# Patient Record
Sex: Female | Born: 1970 | Race: Black or African American | Hispanic: No | Marital: Married | Smoking: Never smoker
Health system: Southern US, Community
[De-identification: ages and names within clinical notes are randomized; demographics above are authoritative.]

## PROBLEM LIST (undated history)

## (undated) DIAGNOSIS — R87810 Cervical high risk human papillomavirus (HPV) DNA test positive: Secondary | ICD-10-CM

## (undated) DIAGNOSIS — R011 Cardiac murmur, unspecified: Secondary | ICD-10-CM

## (undated) DIAGNOSIS — D649 Anemia, unspecified: Secondary | ICD-10-CM

## (undated) HISTORY — DX: Cervical high risk human papillomavirus (HPV) DNA test positive: R87.810

## (undated) HISTORY — PX: TUBAL LIGATION: SHX77

## (undated) HISTORY — DX: Cardiac murmur, unspecified: R01.1

## (undated) HISTORY — DX: Anemia, unspecified: D64.9

---

## 1998-04-24 ENCOUNTER — Emergency Department (HOSPITAL_COMMUNITY): Admission: EM | Admit: 1998-04-24 | Discharge: 1998-04-24 | Payer: Self-pay | Admitting: Emergency Medicine

## 1998-04-28 ENCOUNTER — Emergency Department (HOSPITAL_COMMUNITY): Admission: EM | Admit: 1998-04-28 | Discharge: 1998-04-28 | Payer: Self-pay | Admitting: Emergency Medicine

## 2000-08-07 ENCOUNTER — Emergency Department (HOSPITAL_COMMUNITY): Admission: EM | Admit: 2000-08-07 | Discharge: 2000-08-07 | Payer: Self-pay | Admitting: Emergency Medicine

## 2000-08-07 ENCOUNTER — Encounter: Payer: Self-pay | Admitting: Emergency Medicine

## 2002-06-03 ENCOUNTER — Emergency Department (HOSPITAL_COMMUNITY): Admission: EM | Admit: 2002-06-03 | Discharge: 2002-06-03 | Payer: Self-pay | Admitting: Emergency Medicine

## 2002-06-03 ENCOUNTER — Encounter: Payer: Self-pay | Admitting: Emergency Medicine

## 2005-10-31 ENCOUNTER — Emergency Department (HOSPITAL_COMMUNITY): Admission: EM | Admit: 2005-10-31 | Discharge: 2005-10-31 | Payer: Self-pay | Admitting: Emergency Medicine

## 2012-07-13 DIAGNOSIS — R87810 Cervical high risk human papillomavirus (HPV) DNA test positive: Secondary | ICD-10-CM

## 2012-07-13 HISTORY — DX: Cervical high risk human papillomavirus (HPV) DNA test positive: R87.810

## 2012-07-19 ENCOUNTER — Ambulatory Visit (INDEPENDENT_AMBULATORY_CARE_PROVIDER_SITE_OTHER): Payer: BC Managed Care – PPO | Admitting: Family Medicine

## 2012-07-19 VITALS — BP 110/62 | HR 74 | Temp 99.0°F | Resp 16 | Ht 73.0 in | Wt 138.2 lb

## 2012-07-19 DIAGNOSIS — R0981 Nasal congestion: Secondary | ICD-10-CM

## 2012-07-19 DIAGNOSIS — N951 Menopausal and female climacteric states: Secondary | ICD-10-CM

## 2012-07-19 DIAGNOSIS — J3489 Other specified disorders of nose and nasal sinuses: Secondary | ICD-10-CM

## 2012-07-19 DIAGNOSIS — R1900 Intra-abdominal and pelvic swelling, mass and lump, unspecified site: Secondary | ICD-10-CM

## 2012-07-19 DIAGNOSIS — R509 Fever, unspecified: Secondary | ICD-10-CM

## 2012-07-19 DIAGNOSIS — D509 Iron deficiency anemia, unspecified: Secondary | ICD-10-CM

## 2012-07-19 DIAGNOSIS — J029 Acute pharyngitis, unspecified: Secondary | ICD-10-CM

## 2012-07-19 DIAGNOSIS — R51 Headache: Secondary | ICD-10-CM

## 2012-07-19 DIAGNOSIS — R011 Cardiac murmur, unspecified: Secondary | ICD-10-CM

## 2012-07-19 DIAGNOSIS — R232 Flushing: Secondary | ICD-10-CM

## 2012-07-19 LAB — POCT CBC
Granulocyte percent: 62.2 %G (ref 37–80)
HCT, POC: 27.7 % — AB (ref 37.7–47.9)
Hemoglobin: 8.1 g/dL — AB (ref 12.2–16.2)
Lymph, poc: 1.4 (ref 0.6–3.4)
MCH, POC: 21.4 pg — AB (ref 27–31.2)
MCHC: 29.2 g/dL — AB (ref 31.8–35.4)
MCV: 73.2 fL — AB (ref 80–97)
MID (cbc): 0.5 (ref 0–0.9)
MPV: 7.7 fL (ref 0–99.8)
POC Granulocyte: 3.1 (ref 2–6.9)
POC LYMPH PERCENT: 27.7 %L (ref 10–50)
POC MID %: 10.1 %M (ref 0–12)
Platelet Count, POC: 397 10*3/uL (ref 142–424)
RBC: 3.78 M/uL — AB (ref 4.04–5.48)
RDW, POC: 18.3 %
WBC: 5 10*3/uL (ref 4.6–10.2)

## 2012-07-19 LAB — POCT RAPID STREP A (OFFICE): Rapid Strep A Screen: NEGATIVE

## 2012-07-19 LAB — COMPREHENSIVE METABOLIC PANEL
ALT: 9 U/L (ref 0–35)
AST: 16 U/L (ref 0–37)
Albumin: 4.4 g/dL (ref 3.5–5.2)
Alkaline Phosphatase: 30 U/L — ABNORMAL LOW (ref 39–117)
BUN: 12 mg/dL (ref 6–23)
CO2: 24 mEq/L (ref 19–32)
Calcium: 9.1 mg/dL (ref 8.4–10.5)
Chloride: 107 mEq/L (ref 96–112)
Creat: 0.64 mg/dL (ref 0.50–1.10)
Glucose, Bld: 71 mg/dL (ref 70–99)
Potassium: 4.5 mEq/L (ref 3.5–5.3)
Sodium: 136 mEq/L (ref 135–145)
Total Bilirubin: 0.4 mg/dL (ref 0.3–1.2)
Total Protein: 7.2 g/dL (ref 6.0–8.3)

## 2012-07-19 LAB — POCT URINE PREGNANCY: Preg Test, Ur: NEGATIVE

## 2012-07-19 LAB — POCT INFLUENZA A/B
Influenza A, POC: NEGATIVE
Influenza B, POC: NEGATIVE

## 2012-07-19 MED ORDER — HYDROCODONE-HOMATROPINE 5-1.5 MG/5ML PO SYRP: ORAL_SOLUTION | ORAL | Status: DC

## 2012-07-19 MED ORDER — IPRATROPIUM BROMIDE 0.06 % NA SOLN: 2.0000 | Freq: Three times a day (TID) | NASAL | Status: DC

## 2012-07-19 NOTE — Patient Instructions (Addendum)
Your ultrasound can be done at [facility] [address] [place]  tomorrow at 1:30pm please drink 32 ounces of water 1 hour prior for full bladder, needed for scan.

## 2012-07-19 NOTE — Progress Notes (Signed)
Patient ID: Victoria Schmitt MRN: [id number], DOB: 1971/01/29, 41 y.o. Date of Encounter: 07/19/2012, 12:30 PM  Primary Physician: No primary provider on file.  Chief Complaint: URI symptoms for one day  HPI: 41 y.o. year old female with history below presents with sudden onset of nasal congestion, rhinorrhea, eyes burning, sore throat, ear fullness, and sinus pressure. Symptoms began suddenly the previous evening around 9 PM. Subjective fever and chills over the evening, night, and into today. Patient is generally warm natured, however currently she having chills as well. Headache along the frontal sinus down into the nasal cavity. Nasal congestion clear. Hearing muffled. No cough or chest congestion. Husband sick with similar symptoms, just not as bad as her. Has not taken any medications to try to help her symptoms.  In regards to her above complaint of always feeling warm, for as long as she can remember she has always been hot natured. Has always been thin. Wants to put weight on, and tries to eat to do so, but is unable to do so. When she is active notes that her heart races. No diarrhea. Uncertain if she has had an evaluation of her thyroid before. States that she feels like she always has to be active, however when she is not active she is able to easily fall asleep.  She also mentions during my physical exam today that she has had a heart murmur since childhood, but without formal evaluation. Never with echocardiogram. Notes that throughout life when she would try to run or be active she would get fatigued easily, become short of breath, feel like her heart was racing, and sometimes develop chest pain. Because of this she no longer runs very much. She is currently asymptomatic.   Lastly, she mentions a three month history of an enlarging lower abdominal mass. When she first noticed this mass it was "the size of a golf ball." Now it has continued to enlarge and "has a pulse." She cannot lay on her  stomach because it hurts. She did have BTL 22 years prior. She is sexually active with her husband. No condoms. LMP first part of October, 2013.    History reviewed. No pertinent past medical history.   Home Meds: Prior to Admission medications   Not on File    Allergies: No Known Allergies  History   Social History  . Marital Status: Married    Spouse Name: N/A    Number of Children: N/A  . Years of Education: N/A   Occupational History  . Not on file.   Social History Main Topics  . Smoking status: Never Smoker   . Smokeless tobacco: Not on file  . Alcohol Use: Not on file  . Drug Use: Not on file  . Sexually Active: Not on file   Other Topics Concern  . Not on file   Social History Narrative  . No narrative on file     Review of Systems: Constitutional: negative for night sweats, or weight changes HEENT: see above Cardiovascular: negative for chest pain or palpitations Respiratory: negative for hemoptysis, wheezing, shortness of breath, or cough Abdominal: negative for nausea, vomiting, diarrhea, or constipation Dermatological: negative for rash Neurologic: negative for headache, dizziness, or syncope All other systems reviewed and are otherwise negative with the exception to those above and in the HPI.   Physical Exam: Blood pressure 110/62, pulse 74, temperature 99 F (37.2 C), temperature source Oral, resp. rate 16, height 6\' 1"  (1.854 m), weight 138 lb 3.2  oz (62.687 kg), last menstrual period 06/12/2012, SpO2 100.00%., Body mass index is 18.23 kg/(m^2). General: Well developed, well nourished, in no acute distress. Head: Normocephalic, atraumatic, eyes without discharge, sclera non-icteric, nares are without discharge. Bilateral auditory canals clear, TM's are without perforation, pearly grey and translucent with reflective cone of light bilaterally. Frontal sinus TTP. Oral cavity moist, posterior pharynx without exudate, erythema, peritonsillar abscess, or  post nasal drip. Uvula midline.   Neck: Supple. No thyromegaly or nodules palpated. Full ROM. No lymphadenopathy. Lungs: Clear bilaterally to auscultation without wheezes, rales, or rhonchi. Breathing is unlabored. Heart: RRR with S1 S2. 3/6 systolic murmur LUSB. No rubs or gallops appreciated. Abdomen: Soft, with normoactive bowel sounds. Palpable abdominal mass LLQ that is TTP. No hepatosplenomegaly. No rebound/guarding. No midline abdominal pulsations. Negative McBurney's and Iliopsoas.  Msk:  Strength and tone normal for age. Extremities/Skin: Warm and dry. No clubbing or cyanosis. No edema. No rashes or suspicious lesions. Neuro: Alert and oriented X 3. Moves all extremities spontaneously. Gait is normal. CNII-XII grossly in tact. Psych:  Responds to questions appropriately with a normal affect.   Labs: Results for orders placed in visit on 07/19/12  POCT CBC      Component Value Range   WBC 5.0  4.6 - 10.2 K/uL   Lymph, poc 1.4  0.6 - 3.4   POC LYMPH PERCENT 27.7  10 - 50 %L   MID (cbc) 0.5  0 - 0.9   POC MID % 10.1  0 - 12 %M   POC Granulocyte 3.1  2 - 6.9   Granulocyte percent 62.2  37 - 80 %G   RBC 3.78 (*) 4.04 - 5.48 M/uL   Hemoglobin 8.1 (*) 12.2 - 16.2 g/dL   HCT, POC 16.1 (*) 09.6 - 47.9 %   MCV 73.2 (*) 80 - 97 fL   MCH, POC 21.4 (*) 27 - 31.2 pg   MCHC 29.2 (*) 31.8 - 35.4 g/dL   RDW, POC 04.5     Platelet Count, POC 397  142 - 424 K/uL   MPV 7.7  0 - 99.8 fL  POCT INFLUENZA A/B      Component Value Range   Influenza A, POC Negative     Influenza B, POC Negative    POCT RAPID STREP A (OFFICE)      Component Value Range   Rapid Strep A Screen Negative  Negative  POCT URINE PREGNANCY      Component Value Range   Preg Test, Ur Negative     Throat culture pending TSH, CMP, ferritin, and iron all pending.  ASSESSMENT AND PLAN:  41 y.o. year old female with viral URI, cardiac murmur, microcytic anemia, abdominal mass, and heat intolerance. 1. Viral  URI -Atrovent NS 0.06% 2 sprays each nare bid prn #1 no RF -Hycodan #4oz 1 tsp po q 4-6 hours prn cough no RF SED  2. Cardiac murmur -Longstanding since childhood -Referral to cardiology for evaluation -Refrain from physical activity until cleared by cardiology at this time  3. Abdominal mass -Negative HCG in office -Status post BTL 22 years prior -Pelvic ultrasound with call report today -If fibroid will refer to gyn  4. Microcytic anemia -Add ferritin and iron -Await labs -Treatment pending   5. Heat intolerance -Long standing issue -No formal evaluation -Will await TSH and CMP -Further follow up and treatment pending labs  6. Patient discussed with Dr. Patsy Lager   Signed, Eula Listen, PA-C 07/19/2012 12:30 PM

## 2012-07-20 ENCOUNTER — Ambulatory Visit
Admission: RE | Admit: 2012-07-20 | Discharge: 2012-07-20 | Disposition: A | Discharge status: Home / Self Care | Payer: BC Managed Care – PPO | Source: Ambulatory Visit | Attending: Physician Assistant | Admitting: Physician Assistant

## 2012-07-20 ENCOUNTER — Other Ambulatory Visit: Payer: Self-pay | Admitting: Physician Assistant

## 2012-07-20 ENCOUNTER — Ambulatory Visit
Admission: RE | Admit: 2012-07-20 | Discharge: 2012-07-20 | Disposition: A | Discharge status: Home / Self Care | Payer: BC Managed Care – PPO | Source: Ambulatory Visit | Attending: Family Medicine | Admitting: Family Medicine

## 2012-07-20 DIAGNOSIS — R1900 Intra-abdominal and pelvic swelling, mass and lump, unspecified site: Secondary | ICD-10-CM

## 2012-07-20 DIAGNOSIS — D219 Benign neoplasm of connective and other soft tissue, unspecified: Secondary | ICD-10-CM

## 2012-07-20 LAB — FERRITIN: Ferritin: <1 ng/mL — ABNORMAL LOW (ref 10–291)

## 2012-07-20 LAB — TSH: TSH: 1.453 u[IU]/mL (ref 0.350–4.500)

## 2012-07-21 LAB — CULTURE, GROUP A STREP: Organism ID, Bacteria: NORMAL

## 2012-07-22 ENCOUNTER — Encounter: Payer: Self-pay | Admitting: Family Medicine

## 2012-07-22 ENCOUNTER — Other Ambulatory Visit: Payer: Self-pay | Admitting: Family Medicine

## 2012-07-22 DIAGNOSIS — D259 Leiomyoma of uterus, unspecified: Secondary | ICD-10-CM

## 2012-08-03 ENCOUNTER — Ambulatory Visit (INDEPENDENT_AMBULATORY_CARE_PROVIDER_SITE_OTHER): Payer: BC Managed Care – PPO | Admitting: Gynecology

## 2012-08-03 ENCOUNTER — Encounter: Payer: Self-pay | Admitting: Gynecology

## 2012-08-03 ENCOUNTER — Other Ambulatory Visit (HOSPITAL_COMMUNITY)
Admission: RE | Admit: 2012-08-03 | Discharge: 2012-08-03 | Disposition: A | Discharge status: Home / Self Care | Payer: BC Managed Care – PPO | Source: Ambulatory Visit | Attending: Gynecology | Admitting: Gynecology

## 2012-08-03 VITALS — BP 130/70 | Ht 70.25 in | Wt 133.0 lb

## 2012-08-03 DIAGNOSIS — N920 Excessive and frequent menstruation with regular cycle: Secondary | ICD-10-CM

## 2012-08-03 DIAGNOSIS — R011 Cardiac murmur, unspecified: Secondary | ICD-10-CM

## 2012-08-03 DIAGNOSIS — Z1151 Encounter for screening for human papillomavirus (HPV): Secondary | ICD-10-CM | POA: Insufficient documentation

## 2012-08-03 DIAGNOSIS — Z01419 Encounter for gynecological examination (general) (routine) without abnormal findings: Secondary | ICD-10-CM | POA: Insufficient documentation

## 2012-08-03 DIAGNOSIS — Z124 Encounter for screening for malignant neoplasm of cervix: Secondary | ICD-10-CM

## 2012-08-03 DIAGNOSIS — R87810 Cervical high risk human papillomavirus (HPV) DNA test positive: Secondary | ICD-10-CM | POA: Insufficient documentation

## 2012-08-03 DIAGNOSIS — D259 Leiomyoma of uterus, unspecified: Secondary | ICD-10-CM

## 2012-08-03 DIAGNOSIS — D649 Anemia, unspecified: Secondary | ICD-10-CM

## 2012-08-03 NOTE — Progress Notes (Signed)
Victoria Schmitt Oct 21, 1970 [id number]        41 y.o.  G2P2 presents in consultation from Dr. Alycia Rossetti urgent medical care due to anemia and leiomyoma. Patient has known that she has had fibroids for some time but has not had routine GYN care and said that her last GYN exam was during her pregnancies a number of years ago. Her menses are heavy usually lasting 5 days the following very heavy for one to 2 days. Frequent pad changes in bleedthrough episodes. Most recent evaluation at urgent medical care showed a hemoglobin of 8 and an ultrasound showing an enlarged uterus measuring 18.5 x 9.3 x 15.4 cm. with multiple myomas. Right and left ovaries are not visualized for technical reasons.  She notes pelvic discomfort and the inability to lie on her stomach.  Past medical history,surgical history, medications, allergies, family history and social history were all reviewed and documented in the EPIC chart. ROS:  Was performed and pertinent positives and negatives are included in the history.  Exam: Sherrilyn Rist assistant Filed Vitals:   08/03/12 1437  BP: 130/70  Height: 5' 10.25" (1.784 m)  Weight: 133 lb (60.328 kg)   General appearance  Normal Skin grossly normal Head/Neck normal with no cervical or supraclavicular adenopathy thyroid normal Lungs  clear Cardiac RR, 2/6 systolic ejection murmur Abdominal  soft, nontender with large pelvic mass to the umbilicus firm consistent with history of leiomyoma. Breasts  examined lying and sitting without masses, retractions, discharge or axillary adenopathy. Pelvic  Ext/BUS/vagina  normal   Cervix  normal Pap/HPV  Uterus to the level of the umbilicus, firm irregular consistent with leiomyoma  Adnexa  Unable to evaluate due to uterine size  Anus and perineum  normal   Rectovaginal  normal sphincter tone without palpated masses or tenderness.    Assessment/Plan:  41 y.o. G2P2 female status post tubal sterilization  1. Leiomyoma/anemia/menorrhagia/pelvic pain.  Uterus grossly enlarged 20 week size. Options for management were reviewed to include observation, hormonal manipulation, myomectomy, uterine artery embolization, hysterectomy.  Given her total picture is that hysterectomy is the most reasonable course as she is status post tubal sterilization tell paranasal longer an issue. Recommend nasal suppression to allow hemoglobin recovery and then proceeding with a TAH. Tentatively plan Depo-Lupron x10-12 weeks with surgery in that window. She is on a multivitamin with iron and a separate iron pill emphasize the need to continue this on a daily basis. Side effects and risks of Depo-Lupron reviewed to include menopausal symptoms as well as accelerated bone loss. Patient agrees with the plan we'll move toward scheduling the surgery. She'll represent for those hemoglobin recheck as well as a preoperative consult. 2. Heart murmur. Patient says she's always had and I suspect is a functional murmur. Will have cardiology evaluate preoperative to make sure. I'm sure is accentuated due to her anemia. 3. Pap smear. Pap/HPV done this has not been done for several years. 4. Mammography. Patient's never had mammogram and I recommended she schedule this as baseline and she agrees to do so. SBE monthly reviewed.  We will go ahead and arrange for Depo-Lupron, schedule her surgery, she'll represent for a preoperative consult before hand, she'll continue on her iron and we'll get a cardiology consult in the interim.    Dara Lords MD, 4:33 PM 08/03/2012

## 2012-08-03 NOTE — Patient Instructions (Signed)
Uterine Fibroid  A uterine fibroid is a growth (tumor) that occurs in a woman's uterus. This type of tumor is not cancerous and does not spread out of the uterus. A woman can have one or many fibroids, and the fiboid(s) can become quite large. A fibroid can vary in size, weight, and where it grows in the uterus. Most fibroids do not require medical treatment, but some can cause pain or heavy bleeding during and between periods.  CAUSES   A fibroid is the result of a single uterine cell that keeps growing (unregulated), which is different than most cells in the human body. Most cells have a control mechanism that keeps them from reproducing without control.   SYMPTOMS    Bleeding.   Pelvic pain and pressure.   Bladder problems due to the size of the fibroid.   Infertility and miscarriages depending on the size and location of the fibroid.  DIAGNOSIS   A diagnosis is made by physical exam. Your caregiver may feel the lumpy tumors during a pelvic exam. Important information regarding size, location, and number of tumors can be gained by having an ultrasound. It is rare that other tests, such as a CT scan or MRI, are needed.  TREATMENT    Your caregiver may recommend watchful waiting. This involves getting the fibroid checked by your caregiver to see if the fibroids grow or shrink.    Hormonal treatment or an intrauterine device (IUD) may be prescribed.    Surgery may be needed to remove the fibroids (myomectomy) or the uterus (hysterectomy). This depends on your situation.  When fibroids interfere with fertility and a woman wants to become pregnant, a caregiver may recommend having the fibroids removed.   HOME CARE INSTRUCTIONS   Home care depends on how you were treated. In general:    Keep all follow-up appointments with your caregiver.    Only take medicine as told by your caregiver. Do not take aspirin. It can cause bleeding.    If you have excessive periods and soak tampons or pads in a half hour or  less, contact your caregiver immediately. If your periods are troublesome but not so heavy, lie down with your feet raised slightly above your heart. Place cold packs on your lower abdomen.    If your periods are heavy, write down the number of pads or tampons you use per month. Bring this information to your caregiver.    Talk to your caregiver about taking iron pills.    Include green vegetables in your diet.    If you were prescribed a hormonal treatment, take the hormonal medicines as directed.    If you need surgery, ask your caregiver for information on your specific surgery.   SEEK IMMEDIATE MEDICAL CARE IF:   You have pelvic pain or cramps not controlled with medicines.    You have a sudden increase in pelvic pain.    You have an increase of bleeding between and during periods.    You feel lightheaded or have fainting episodes.   MAKE SURE YOU:   Understand these instructions.   Will watch your condition.   Will get help right away if you are not doing well or get worse.  Document Released: 08/26/2000 Document Revised: 11/21/2011 Document Reviewed: 09/19/2011  ExitCare Patient Information 2013 [employer].

## 2012-08-06 ENCOUNTER — Telehealth: Payer: Self-pay | Admitting: Gynecology

## 2012-08-06 NOTE — Telephone Encounter (Signed)
Left message for patient to call to let her know I will fax form for Lupron and she will hear from them with benefits and she will need to give them okay to ship Rx to [place].  Also, to discuss with her Dr. Velvet Bathe wants her to see  Dr. Donnie Aho, cardiologist, for heart murmur.

## 2012-08-07 ENCOUNTER — Ambulatory Visit: Payer: Self-pay | Admitting: Cardiovascular Disease

## 2012-08-13 ENCOUNTER — Telehealth: Payer: Self-pay | Admitting: Gynecology

## 2012-08-13 ENCOUNTER — Encounter: Payer: Self-pay | Admitting: Gynecology

## 2012-08-13 MED ORDER — METRONIDAZOLE 500 MG PO TABS: 500.0000 mg | ORAL_TABLET | Freq: Two times a day (BID) | ORAL | Status: DC

## 2012-08-13 NOTE — Telephone Encounter (Signed)
Tell patient that her Pap smear showed: 1. Trichomonas which is a bacterial-like infection. It can be sexually transmitted. I recommend Flagyl 500 mg twice a day x7 days. Alcohol avoidance during this time.  Sexual contacts need to be seen and treated. I am going to run a GC/Chlamydia screen on her Pap smear also. 2. 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 Recommend repeat Pap smear in one year to follow up on this.

## 2012-08-14 NOTE — Telephone Encounter (Signed)
Left another message for patient to call me for results and to discuss Lupron arrangements.

## 2012-08-14 NOTE — Telephone Encounter (Signed)
Left message patient to call me for results.

## 2012-08-15 NOTE — Telephone Encounter (Signed)
Patient informed of all below. Rx already e-scribed by Dr. Velvet Bathe. Pt advised.

## 2012-08-15 NOTE — Telephone Encounter (Signed)
Patient called.  We discussed that she has received call from [facility] but has not called them back.  I told her it is important she return their call as they are going to review ins benefits. and cost of Lupron with her. She will need to be the one to authorize shipping to [place].  She said she will call them when she gets off work today. I discussed with patient that Dr. Velvet Bathe has recommended evaluation of heart murmur preoperatively for clearance for surgery.  Patient already has appointment scheduled with Dr. Eden Emms at [facility] for Dec 12th.  I called and asked them to add to appt notes that appt was for cardiac clearance for TAH.    I will contact patient when her Lupron arrive.

## 2012-08-22 ENCOUNTER — Encounter: Payer: Self-pay | Admitting: Cardiovascular Disease

## 2012-08-22 DIAGNOSIS — D649 Anemia, unspecified: Secondary | ICD-10-CM | POA: Insufficient documentation

## 2012-08-22 DIAGNOSIS — D219 Benign neoplasm of connective and other soft tissue, unspecified: Secondary | ICD-10-CM | POA: Insufficient documentation

## 2012-08-22 DIAGNOSIS — R011 Cardiac murmur, unspecified: Secondary | ICD-10-CM | POA: Insufficient documentation

## 2012-08-23 ENCOUNTER — Encounter: Payer: Self-pay | Admitting: Cardiovascular Disease

## 2012-08-23 ENCOUNTER — Ambulatory Visit (INDEPENDENT_AMBULATORY_CARE_PROVIDER_SITE_OTHER): Payer: BC Managed Care – PPO | Admitting: Cardiovascular Disease

## 2012-08-23 VITALS — BP 105/68 | HR 58 | Ht 72.0 in | Wt 134.0 lb

## 2012-08-23 DIAGNOSIS — D219 Benign neoplasm of connective and other soft tissue, unspecified: Secondary | ICD-10-CM

## 2012-08-23 DIAGNOSIS — D259 Leiomyoma of uterus, unspecified: Secondary | ICD-10-CM

## 2012-08-23 DIAGNOSIS — D649 Anemia, unspecified: Secondary | ICD-10-CM

## 2012-08-23 DIAGNOSIS — R011 Cardiac murmur, unspecified: Secondary | ICD-10-CM

## 2012-08-23 NOTE — Assessment & Plan Note (Signed)
FESO$ and erythropoitin  Monitor post op  I think her dyspea is related to this and not her heart

## 2012-08-23 NOTE — Assessment & Plan Note (Signed)
AV murmur  Echo does not sound severe  No need for SBE

## 2012-08-23 NOTE — Patient Instructions (Signed)
Your physician recommends that you schedule a follow-up appointment in:  AS NEEDED Your physician recommends that you continue on your current medications as directed. Please refer to the Current Medication list given to you today. Your physician has requested that you have an echocardiogram. Echocardiography is a painless test that uses sound waves to create images of your heart. It provides your doctor with information about the size and shape of your heart and how well your heart's chambers and valves are working. This procedure takes approximately one hour. There are no restrictions for this procedure. DX MURMUR

## 2012-08-23 NOTE — Assessment & Plan Note (Signed)
Clear to have Ob/Gyn surgery

## 2012-08-23 NOTE — Progress Notes (Signed)
Patient ID: Victoria Schmitt, female   DOB: [birth year], 41 y.o.   MRN: [id number] 41 yo referred by [facility] for surgical clearance Has murmur.  Needs TAH for leiyomyoma and anemia.  Indicates long standing history of murmur Mild exertional dyspnea since she has been anemic.  Never had echo. No chest pain or palpitations. No family history of congential heart disease.  Denies history of rheumatic fever Lupus SBE.  Needs a shot before surgery ? Erythropoitin.    ROS: Denies fever, malais, weight loss, blurry vision, decreased visual acuity, cough, sputum, SOB, hemoptysis, pleuritic pain, palpitaitons, heartburn, abdominal pain, melena, lower extremity edema, claudication, or rash.  All other systems reviewed and negative   General: Affect appropriate Thin black female HEENT: normal Neck supple with no adenopathy JVP normal no bruits no thyromegaly Lungs clear with no wheezing and good diaphragmatic motion Heart:  S1/S2 2/6 SEM louder in recumbancy no ,rub, gallop or click PMI normal Abdomen: benighn, BS positve, no tenderness, no AAA no bruit.  No HSM or HJR Distal pulses intact with no bruits No edema Neuro non-focal Skin warm and dry No muscular weakness  Medications Current Outpatient Prescriptions  Medication Sig Dispense Refill  . HYDROcodone-homatropine (HYCODAN) 5-1.5 MG/5ML syrup 1 TSP PO Q 4-6 HOURS PRN COUGH  120 mL  0  . ipratropium (ATROVENT) 0.06 % nasal spray Place 2 sprays into the nose 3 (three) times daily.  15 mL  0  . IRON PO Take by mouth.      . metroNIDAZOLE (FLAGYL) 500 MG tablet Take 1 tablet (500 mg total) by mouth 2 (two) times daily. For 7 days.  Avoid alcohol while taking  14 tablet  0  . Multiple Vitamin (MULTIVITAMIN) tablet Take 1 tablet by mouth daily.        Allergies Review of patient's allergies indicates no known allergies.  Family History: Family History  Problem Relation Age of Onset  . Diabetes Maternal Grandmother   . Seizures Maternal Grandfather    . Cancer Maternal Aunt   . Cancer Maternal Uncle   . Diabetes Paternal Grandmother   . Diabetes Paternal Grandfather     Social History: History   Social History  . Marital Status: Married    Spouse Name: N/A    Number of Children: N/A  . Years of Education: N/A   Occupational History  . Not on file.   Social History Main Topics  . Smoking status: Never Smoker   . Smokeless tobacco: Never Used  . Alcohol Use: Yes     Comment: occassional  . Drug Use: No  . Sexually Active: Yes    Birth Control/ Protection: Surgical   Other Topics Concern  . Not on file   Social History Narrative  . No narrative on file    Electrocardiogram:  SR rate 58 normal  Assessment and Plan

## 2012-08-31 ENCOUNTER — Other Ambulatory Visit (HOSPITAL_COMMUNITY): Payer: BC Managed Care – PPO

## 2012-09-06 ENCOUNTER — Ambulatory Visit (HOSPITAL_COMMUNITY): Payer: BC Managed Care – PPO | Attending: Cardiovascular Disease | Admitting: Radiology

## 2012-09-06 DIAGNOSIS — I369 Nonrheumatic tricuspid valve disorder, unspecified: Secondary | ICD-10-CM | POA: Insufficient documentation

## 2012-09-06 DIAGNOSIS — I059 Rheumatic mitral valve disease, unspecified: Secondary | ICD-10-CM | POA: Insufficient documentation

## 2012-09-06 DIAGNOSIS — R011 Cardiac murmur, unspecified: Secondary | ICD-10-CM

## 2012-09-06 NOTE — Progress Notes (Signed)
Echocardiogram performed.  

## 2012-09-07 ENCOUNTER — Telehealth: Payer: Self-pay | Admitting: Cardiovascular Disease

## 2012-09-07 NOTE — Telephone Encounter (Signed)
New Problem: ° ° ° °Patient returned your call.  Please call back. °

## 2012-09-07 NOTE — Telephone Encounter (Signed)
Lmtcb./cy 

## 2012-09-10 ENCOUNTER — Telehealth: Payer: Self-pay | Admitting: Gynecology

## 2012-09-10 NOTE — Telephone Encounter (Signed)
Patient called and said she spoke with the Lupron people and they told her she had $400 copayment and she thought they were going to send [place] the medication.  She asked about her surgery date.  I called [employer] as that is the last fax contact I had.  The rep at [employer] said that they had spoken with the patient and she was going to check on co-payment financial assistance and once she had done that she needs to call them back and authorize shipment of medication.  I called patient back and left a message with the above and the phone number that she needs to call to authorize shipment of Lupron.  I did explain to her that it would be 10-12 weeks from injection before we would schedule surgery and I am waiting on Feb schedule at this point.

## 2012-09-10 NOTE — Telephone Encounter (Signed)
I contacted patient to follow up with her regarding Lupron.  I had received several faxes indicating ins benefits and that no prior auth required.  Also, one that said rx was sent to [facility] and awaiting authorization from patient to fill it.  I left message today and asked patient to get back with me to let me know status of this and if this is something she is planning to do.

## 2012-09-25 ENCOUNTER — Encounter: Payer: Self-pay | Admitting: *Deleted

## 2012-09-25 NOTE — Telephone Encounter (Signed)
RESULTS LETTER SENT TO PT VIA MAIL .Victoria Schmitt

## 2012-09-26 ENCOUNTER — Telehealth: Payer: Self-pay

## 2012-09-26 NOTE — Telephone Encounter (Signed)
Left message that Lupron has arrived and to please call me so I can schedule a time for her to receive injection.  Also, reminded her surgery to be scheduled 10-12 weeks after injection (end of March beg of April) and I do not have those schedules yet.

## 2012-09-27 ENCOUNTER — Ambulatory Visit (INDEPENDENT_AMBULATORY_CARE_PROVIDER_SITE_OTHER): Payer: BC Managed Care – PPO | Admitting: *Deleted

## 2012-09-27 DIAGNOSIS — D259 Leiomyoma of uterus, unspecified: Secondary | ICD-10-CM

## 2012-09-27 DIAGNOSIS — N920 Excessive and frequent menstruation with regular cycle: Secondary | ICD-10-CM

## 2012-09-27 DIAGNOSIS — D649 Anemia, unspecified: Secondary | ICD-10-CM

## 2012-09-27 MED ORDER — LEUPROLIDE ACETATE (PED) 15 MG IM KIT: 15.0000 mg | PACK | INTRAMUSCULAR | Status: DC

## 2012-09-27 MED ORDER — LEUPROLIDE ACETATE (3 MONTH) 11.25 MG IM KIT
11.2500 mg | PACK | Freq: Once | INTRAMUSCULAR | Status: AC
  Administered 2012-09-27: 11.25 mg via INTRAMUSCULAR

## 2012-10-16 ENCOUNTER — Telehealth: Payer: Self-pay

## 2012-10-16 NOTE — Telephone Encounter (Signed)
Left message for patient to call to discuss scheduling surgery.  10 weeks from Lupron inj would be end of March.  I offered patient Mon March 31 or to wait for the April schedule.

## 2012-11-16 SURGERY — Surgical Case
Anesthesia: *Unknown

## 2012-11-23 ENCOUNTER — Telehealth: Payer: Self-pay | Admitting: *Deleted

## 2012-11-23 NOTE — Telephone Encounter (Signed)
Ok to have surgery

## 2012-11-23 NOTE — Telephone Encounter (Signed)
PT NEEDING TO HAVE HYSTERECTOMY  MAY PT PROCEED  ECHO WAS NORMAL /CY

## 2012-11-26 NOTE — Telephone Encounter (Signed)
Note forwarded to  Dr Reynold Bowen office . Pt may proceed with surgery per Dr Eden Emms.

## 2012-11-28 ENCOUNTER — Telehealth: Payer: Self-pay

## 2012-11-28 MED ORDER — NORETHINDRONE ACETATE 5 MG PO TABS: 5.0000 mg | ORAL_TABLET | Freq: Every day | ORAL | Status: DC

## 2012-11-28 NOTE — Telephone Encounter (Signed)
Patient complained of being terribly uncomfortable with hotflashes and wondered if Lupron could be causing. Asked if there was anything you could recommend?

## 2012-11-28 NOTE — Telephone Encounter (Signed)
I called patient and left message explaining to patient regarding need for Lupron 3.75 inectionj to hold her until soonest available date of April 29 for surgery.  I asked her to call me and let me know if this sounds okay for her.

## 2012-11-28 NOTE — Telephone Encounter (Signed)
Patient called. She would like to reschedule her surgery until 4/29 and understands need for Lupron 3.75mg .  I rescheduled her surgery and pre-op consult and will start the paper work for ordering Lupron.

## 2012-11-28 NOTE — Telephone Encounter (Signed)
Patient had Lupron 11.25 on 09/27/12.  TAH is scheduled for 12/17/12.  Patient's employer informed her that her Short Term Disability insurance does not go into effect until 12/26/12.  She asked about rescheduling her surgery until after this date in order to use her STD insurance while out of work for recovery.  12/20/12 is 12 weeks post Lupron.  The first date available to reschedule her to is 01/08/13 and that is 14.5 weeks from Lupron.  Will that work or will patient need another injection of Lupron?  Please advise.

## 2012-11-28 NOTE — Telephone Encounter (Signed)
Recommend Aygestin 5 mg daily. #30 with 1 refill prescribed

## 2012-11-28 NOTE — Telephone Encounter (Signed)
I would recommend a one-month 3.75 mg Depo-Lupron shot to cover her in that. Because I am afraid that she would start to have breakthrough bleeding in the interim.

## 2012-11-28 NOTE — Telephone Encounter (Signed)
Left message on voice mail that Dr. Velvet Bathe called in Rx.

## 2012-12-07 ENCOUNTER — Ambulatory Visit: Payer: BC Managed Care – PPO | Admitting: Gynecology

## 2012-12-11 ENCOUNTER — Telehealth: Payer: Self-pay

## 2012-12-11 NOTE — Telephone Encounter (Signed)
[facility] called to arrange shipment for Lupron 3.75mg  to arrive on Thursday 12/13/12. Ref #[id number]  I called patient and informed her. I will call her when it arrives to we can schedule her injection.

## 2012-12-13 ENCOUNTER — Telehealth: Payer: Self-pay

## 2012-12-13 ENCOUNTER — Ambulatory Visit (INDEPENDENT_AMBULATORY_CARE_PROVIDER_SITE_OTHER): Payer: BC Managed Care – PPO | Admitting: *Deleted

## 2012-12-13 DIAGNOSIS — D259 Leiomyoma of uterus, unspecified: Secondary | ICD-10-CM

## 2012-12-13 DIAGNOSIS — N920 Excessive and frequent menstruation with regular cycle: Secondary | ICD-10-CM

## 2012-12-13 MED ORDER — LEUPROLIDE ACETATE 3.75 MG IM KIT
3.7500 mg | PACK | Freq: Once | INTRAMUSCULAR | Status: AC
  Administered 2012-12-13: 3.75 mg via INTRAMUSCULAR

## 2012-12-13 NOTE — Telephone Encounter (Signed)
Left message on voice mail that Lupron has arrived and for patient to call me back and tell me what time she would like to come by today for injection.

## 2012-12-13 NOTE — Telephone Encounter (Signed)
Patient called and was coming in at 12:30pm for injection.

## 2012-12-25 ENCOUNTER — Other Ambulatory Visit (HOSPITAL_COMMUNITY): Payer: BC Managed Care – PPO

## 2012-12-26 ENCOUNTER — Encounter (HOSPITAL_COMMUNITY): Payer: Self-pay | Admitting: Pharmacist

## 2012-12-28 ENCOUNTER — Encounter (HOSPITAL_COMMUNITY): Payer: Self-pay

## 2012-12-28 ENCOUNTER — Encounter (HOSPITAL_COMMUNITY)
Admission: RE | Admit: 2012-12-28 | Discharge: 2012-12-28 | Disposition: A | Discharge status: Home / Self Care | Payer: BC Managed Care – PPO | Source: Ambulatory Visit | Attending: Gynecology | Admitting: Gynecology

## 2012-12-28 DIAGNOSIS — Z01818 Encounter for other preprocedural examination: Secondary | ICD-10-CM | POA: Insufficient documentation

## 2012-12-28 DIAGNOSIS — Z01812 Encounter for preprocedural laboratory examination: Secondary | ICD-10-CM | POA: Insufficient documentation

## 2012-12-28 LAB — CBC
HCT: 38.4 % (ref 36.0–46.0)
Hemoglobin: 12.6 g/dL (ref 12.0–15.0)
MCH: 30.4 pg (ref 26.0–34.0)
MCV: 92.8 fL (ref 78.0–100.0)
Platelets: 268 10*3/uL (ref 150–400)
RBC: 4.14 MIL/uL (ref 3.87–5.11)

## 2012-12-28 NOTE — Patient Instructions (Addendum)
20 Gracelynn Bircher  12/28/2012   Your procedure is scheduled on:  01/08/13  Enter through the Main Entrance of [facility] at 6 AM.  Pick up the phone at the desk and dial [phone].   Call this number if you have problems the morning of surgery: [phone]   Remember:   Do not eat food:After Midnight.  Do not drink clear liquids: After Midnight.  Take these medicines the morning of surgery with A SIP OF WATER: NA   Do not wear jewelry, make-up or nail polish.  Do not wear lotions, powders, or perfumes. You may wear deodorant.  Do not shave 48 hours prior to surgery.  Do not bring valuables to the hospital.  Contacts, dentures or bridgework may not be worn into surgery.  Leave suitcase in the car. After surgery it may be brought to your room.  For patients admitted to the hospital, checkout time is 11:00 AM the day of discharge.   Patients discharged the day of surgery will not be allowed to drive home.  Name and phone number of your driver: NA  Special Instructions: Shower using CHG 2 nights before surgery and the night before surgery.  If you shower the day of surgery use CHG.  Use special wash - you have one bottle of CHG for all showers.  You should use approximately 1/3 of the bottle for each shower.   Please read over the following fact sheets that you were given: MRSA Information

## 2012-12-31 ENCOUNTER — Encounter: Payer: Self-pay | Admitting: Gynecology

## 2012-12-31 ENCOUNTER — Ambulatory Visit: Payer: Self-pay | Admitting: Gynecology

## 2013-01-01 ENCOUNTER — Encounter: Payer: Self-pay | Admitting: Gynecology

## 2013-01-01 ENCOUNTER — Ambulatory Visit (INDEPENDENT_AMBULATORY_CARE_PROVIDER_SITE_OTHER): Payer: BC Managed Care – PPO | Admitting: Gynecology

## 2013-01-01 DIAGNOSIS — N949 Unspecified condition associated with female genital organs and menstrual cycle: Secondary | ICD-10-CM

## 2013-01-01 DIAGNOSIS — D259 Leiomyoma of uterus, unspecified: Secondary | ICD-10-CM

## 2013-01-01 DIAGNOSIS — D500 Iron deficiency anemia secondary to blood loss (chronic): Secondary | ICD-10-CM

## 2013-01-01 DIAGNOSIS — N920 Excessive and frequent menstruation with regular cycle: Secondary | ICD-10-CM

## 2013-01-01 DIAGNOSIS — R102 Pelvic and perineal pain: Secondary | ICD-10-CM

## 2013-01-01 NOTE — Patient Instructions (Signed)
Followup for surgery as scheduled. Call beforehand if you have any questions. 

## 2013-01-01 NOTE — H&P (Signed)
Victoria Schmitt 29-Mar-1971 [id number]   History and Physical  Chief complaint: Leiomyoma, menorrhagia, anemia, pelvic pain  History of present illness: 42 y.o. G2P2 initially presented November 2013 in consultation from urgent medical care due to anemia and leiomyoma. Patient has known that she has had fibroids for some time but has not had routine GYN care and said that her last GYN exam was during her pregnancies a number of years ago. Her menses are heavy usually lasting 5 days flowing very heavy for one to 2 days. Frequent pad changes and bleedthrough episodes. Evaluation at urgent medical care showed a hemoglobin of 8 and an ultrasound showing an enlarged uterus measuring 18.5 x 9.3 x 15.4 cm. with multiple myomas. Right and left ovaries are not visualized for technical reasons. She notes pelvic discomfort and the inability to lie on her stomach.  Patient is status post tubal sterilization. Options for management were reviewed to include observation, attempt at hormonal manipulation, uterine artery embolization, multiple myomectomy, hysterectomy.  Patient elects for hysterectomy and due to the anemia she has been on Depo-Lupron suppression with a most recent hemoglobin of 12. Options to attempt laparoscopic approach discussed but ultimately we agree the most prudent approach would be TAH and she is admitted for TAH.Marland Kitchen   Past medical history,surgical history, medications, allergies, family history and social history were all reviewed and documented in the EPIC chart. ROS:  Was performed and pertinent positives and negatives are included in the history of present illness.  Exam:  Kim assistant General: well developed, well nourished female, no acute distress HEENT: normal  Lungs: clear to auscultation without wheezing, rales or rhonchi  Cardiac: regular rate without rubs, murmurs or gallops  Abdomen: soft, mild tenderness to uterine manipulation. Uterus at umbilicus, nodular consistent with  leiomyoma  Pelvic: external bus vagina: normal   Cervix: grossly normal  Uterus: 18-20 week size irregular firm consistent with leiomyoma Adnexa: Unable to assess due to uterine size      Assessment/Plan:  42 y.o. G2P2 with significant symptomatic leiomyoma as evidenced by menorrhagia, anemia, pelvic pain. Options for management reviewed per above and she elects for TAH.  I reviewed the proposed surgery with her to include the expected intraoperative and postoperative courses as well as the recovery period. Absolute irreversible sterility associated with hysterectomy was discussed. Sexuality following hysterectomy reviewed to include the risks of persistent orgasmic dysfunction as well as persistent dyspareunia. The ovarian conservation issue was discussed and the options to remove both ovaries or keep both ovaries reviewed. Given her young age and no family history of ovarian cancer she elects for keeping both ovaries she understands the risk of ovarian disease in the future to include ovarian cancer. She also gives me permission to remove one or both ovaries if it is my intraoperative decision that it would be best for her and she accepts this.  The risks of infection requiring prolonged antibiotics as well as risk of abscess/hematoma formation requiring reoperation was reviewed. The risks of hemorrhage necessitating transfusion and the risks of transfusion to include transfusion reaction hepatitis HIV mad cow disease and other unknown entities was discussed with her.  Incisional complications requiring opening and draining of the incisions and closure by secondary intention as well as long-term issues of cosmetics/keloid and hernia formation reviewed. The risk of inadvertent injury to internal organs, either immediately recognized or delay recognized, including bowel bladder ureters vessels and nerves necessitating major exploratory reparative surgeries in the future reparative surgeries including ostomy  formation, bowel  resection, bladder repair, ureteral damage repair all reviewed understood and accepted. The patient's questions were answered to her satisfaction and she will be admitted for surgery. Of note her prior murmur from which she received cardiac clearance has resolved and I think this was due to her anemia as a flow murmur which has now been corrected. We also reviewed her preoperative Pap smear showed normal cytology but a positive high risk HPV screen and we discussed this. Issues as to whether to followup with Pap smears of the vaginal cuff afterwards or not was reviewed and at this point my recommendation would be to monitor at least times several the vaginal cuff with Pap smears in the future and we'll rediscuss this postoperatively.    Dara Lords MD, 11:27 AM 01/01/2013

## 2013-01-01 NOTE — Progress Notes (Signed)
Victoria Schmitt 03-05-1971 [id number]   Preoperative consultation  Chief complaint: Leiomyoma, menorrhagia, anemia, pelvic pain  History of present illness: 42 y.o. G2P2 initially presented November 2013 in consultation from urgent medical care due to anemia and leiomyoma. Patient has known that she has had fibroids for some time but has not had routine GYN care and said that her last GYN exam was during her pregnancies a number of years ago. Her menses are heavy usually lasting 5 days flowing very heavy for one to 2 days. Frequent pad changes and bleedthrough episodes. Evaluation at urgent medical care showed a hemoglobin of 8 and an ultrasound showing an enlarged uterus measuring 18.5 x 9.3 x 15.4 cm. with multiple myomas. Right and left ovaries are not visualized for technical reasons. She notes pelvic discomfort and the inability to lie on her stomach.  Patient is status post tubal sterilization. Options for management were reviewed to include observation, attempt at hormonal manipulation, uterine artery embolization, multiple myomectomy, hysterectomy.  Patient elects for hysterectomy and due to the anemia she has been on Depo-Lupron suppression with a most recent hemoglobin of 12. Options to attempt laparoscopic approach discussed but ultimately we agree the most prudent approach would be TAH and she is admitted for TAH.Marland Kitchen   Past medical history,surgical history, medications, allergies, family history and social history were all reviewed and documented in the EPIC chart. ROS:  Was performed and pertinent positives and negatives are included in the history of present illness.  Exam:  Kim assistant General: well developed, well nourished female, no acute distress HEENT: normal  Lungs: clear to auscultation without wheezing, rales or rhonchi  Cardiac: regular rate without rubs, murmurs or gallops  Abdomen: soft, mild tenderness to uterine manipulation. Uterus at umbilicus, nodular consistent with  leiomyoma  Pelvic: external bus vagina: normal   Cervix: grossly normal  Uterus: 18-20 week size irregular firm consistent with leiomyoma Adnexa: Unable to assess due to uterine size      Assessment/Plan:  42 y.o. G2P2 with significant symptomatic leiomyoma as evidenced by menorrhagia, anemia, pelvic pain. Options for management reviewed per above and she elects for TAH.  I reviewed the proposed surgery with her to include the expected intraoperative and postoperative courses as well as the recovery period. Absolute irreversible sterility associated with hysterectomy was discussed. Sexuality following hysterectomy reviewed to include the risks of persistent orgasmic dysfunction as well as persistent dyspareunia. The ovarian conservation issue was discussed and the options to remove both ovaries or keep both ovaries reviewed. Given her young age and no family history of ovarian cancer she elects for keeping both ovaries she understands the risk of ovarian disease in the future to include ovarian cancer. She also gives me permission to remove one or both ovaries if it is my intraoperative decision that it would be best for her and she accepts this.  The risks of infection requiring prolonged antibiotics as well as risk of abscess/hematoma formation requiring reoperation was reviewed. The risks of hemorrhage necessitating transfusion and the risks of transfusion to include transfusion reaction hepatitis HIV mad cow disease and other unknown entities was discussed with her.  Incisional complications requiring opening and draining of the incisions and closure by secondary intention as well as long-term issues of cosmetics/keloid and hernia formation reviewed. The risk of inadvertent injury to internal organs, either immediately recognized or delay recognized, including bowel bladder ureters vessels and nerves necessitating major exploratory reparative surgeries in the future reparative surgeries including ostomy  formation, bowel resection,  bladder repair, ureteral damage repair all reviewed understood and accepted. The patient's questions were answered to her satisfaction and she will be admitted for surgery. Of note her prior murmur from which she received cardiac clearance has resolved and I think this was due to her anemia as a flow murmur which has now been corrected. We also reviewed her preoperative Pap smear showed normal cytology but a positive high risk HPV screen and we discussed this. Issues as to whether to followup with Pap smears of the vaginal cuff afterwards or not was reviewed and at this point my recommendation would be to monitor at least times several the vaginal cuff with Pap smears in the future and we'll rediscuss this postoperatively.   Dara Lords MD, 9:51 AM 01/01/2013

## 2013-01-04 ENCOUNTER — Telehealth: Payer: Self-pay

## 2013-01-04 NOTE — Telephone Encounter (Signed)
Patient came by and paid her surgery pre-payment today. She is scheduled for surgery on Tuesday.  She is anxious about surgery and asked if Dr. Velvet Bathe might call her something in to "help her nerves" for prior to surgery.

## 2013-01-04 NOTE — Telephone Encounter (Signed)
Patient was informed Dr. Velvet Bathe off and may not address until Monday.

## 2013-01-06 NOTE — Telephone Encounter (Signed)
Xanax 0.25 Q 6 hours prn anxiety # 20 ok to call in

## 2013-01-07 ENCOUNTER — Other Ambulatory Visit: Payer: Self-pay | Admitting: Gynecology

## 2013-01-07 ENCOUNTER — Encounter (HOSPITAL_COMMUNITY): Payer: Self-pay | Admitting: Anesthesiology

## 2013-01-07 MED ORDER — DEXTROSE 5 % IV SOLN
2.0000 g | INTRAVENOUS | Status: AC
  Administered 2013-01-08: 2 g via INTRAVENOUS
  Filled 2013-01-07: qty 2

## 2013-01-07 MED ORDER — ALPRAZOLAM 0.25 MG PO TABS: 0.2500 mg | ORAL_TABLET | Freq: Four times a day (QID) | ORAL | Status: DC

## 2013-01-07 NOTE — Telephone Encounter (Signed)
Rx e-scribed. Patient notified.

## 2013-01-07 NOTE — Anesthesia Preprocedure Evaluation (Addendum)
Anesthesia Evaluation  Patient identified by MRN, date of birth, ID band Patient awake    Reviewed: Allergy & Precautions, H&P , NPO status , Patient's Chart, lab work & pertinent test results  History of Anesthesia Complications (+) PONV  Airway Mallampati: II TM Distance: >3 FB Neck ROM: Full    Dental  (+) Poor Dentition and Chipped   Pulmonary neg pulmonary ROS,  breath sounds clear to auscultation  Pulmonary exam normal       Cardiovascular negative cardio ROS  + Valvular Problems/Murmurs Rhythm:Regular Rate:Normal     Neuro/Psych negative neurological ROS  negative psych ROS   GI/Hepatic negative GI ROS, Neg liver ROS,   Endo/Other  negative endocrine ROS  Renal/GU negative Renal ROS  negative genitourinary   Musculoskeletal negative musculoskeletal ROS (+)   Abdominal   Peds  Hematology  (+) Blood dyscrasia, anemia ,   Anesthesia Other Findings   Reproductive/Obstetrics Leiomyoma                         Anesthesia Physical Anesthesia Plan  ASA: II  Anesthesia Plan: General   Post-op Pain Management:    Induction: Intravenous  Airway Management Planned: Oral ETT  Additional Equipment:   Intra-op Plan:   Post-operative Plan: Extubation in OR  Informed Consent: I have reviewed the patients History and Physical, chart, labs and discussed the procedure including the risks, benefits and alternatives for the proposed anesthesia with the patient or authorized representative who has indicated his/her understanding and acceptance.   Dental advisory given  Plan Discussed with: CRNA, Anesthesiologist and Surgeon  Anesthesia Plan Comments:         Anesthesia Quick Evaluation

## 2013-01-08 ENCOUNTER — Encounter (HOSPITAL_COMMUNITY): Payer: Self-pay | Admitting: Anesthesiology

## 2013-01-08 ENCOUNTER — Encounter (HOSPITAL_COMMUNITY): Payer: Self-pay | Admitting: *Deleted

## 2013-01-08 ENCOUNTER — Inpatient Hospital Stay (HOSPITAL_COMMUNITY): Payer: BC Managed Care – PPO | Admitting: Anesthesiology

## 2013-01-08 ENCOUNTER — Encounter (HOSPITAL_COMMUNITY)
Admission: RE | Disposition: A | Discharge status: Home / Self Care | Payer: Self-pay | Source: Ambulatory Visit | Attending: Gynecology

## 2013-01-08 ENCOUNTER — Observation Stay (HOSPITAL_COMMUNITY)
Admission: RE | Admit: 2013-01-08 | Discharge: 2013-01-09 | Disposition: A | Discharge status: Home / Self Care | Payer: BC Managed Care – PPO | Source: Ambulatory Visit | Attending: Gynecology | Admitting: Gynecology

## 2013-01-08 DIAGNOSIS — N8000 Endometriosis of the uterus, unspecified: Secondary | ICD-10-CM | POA: Insufficient documentation

## 2013-01-08 DIAGNOSIS — N949 Unspecified condition associated with female genital organs and menstrual cycle: Secondary | ICD-10-CM | POA: Insufficient documentation

## 2013-01-08 DIAGNOSIS — IMO0002 Reserved for concepts with insufficient information to code with codable children: Secondary | ICD-10-CM | POA: Insufficient documentation

## 2013-01-08 DIAGNOSIS — D649 Anemia, unspecified: Secondary | ICD-10-CM

## 2013-01-08 DIAGNOSIS — N840 Polyp of corpus uteri: Secondary | ICD-10-CM | POA: Insufficient documentation

## 2013-01-08 DIAGNOSIS — N802 Endometriosis of fallopian tube: Secondary | ICD-10-CM | POA: Insufficient documentation

## 2013-01-08 DIAGNOSIS — N920 Excessive and frequent menstruation with regular cycle: Secondary | ICD-10-CM | POA: Insufficient documentation

## 2013-01-08 DIAGNOSIS — D252 Subserosal leiomyoma of uterus: Secondary | ICD-10-CM | POA: Insufficient documentation

## 2013-01-08 DIAGNOSIS — N80209 Endometriosis of unspecified fallopian tube, unspecified depth: Secondary | ICD-10-CM | POA: Insufficient documentation

## 2013-01-08 DIAGNOSIS — D6489 Other specified anemias: Secondary | ICD-10-CM | POA: Insufficient documentation

## 2013-01-08 DIAGNOSIS — D250 Submucous leiomyoma of uterus: Principal | ICD-10-CM | POA: Insufficient documentation

## 2013-01-08 DIAGNOSIS — D259 Leiomyoma of uterus, unspecified: Secondary | ICD-10-CM

## 2013-01-08 DIAGNOSIS — Z9889 Other specified postprocedural states: Secondary | ICD-10-CM

## 2013-01-08 DIAGNOSIS — N852 Hypertrophy of uterus: Secondary | ICD-10-CM | POA: Insufficient documentation

## 2013-01-08 DIAGNOSIS — D251 Intramural leiomyoma of uterus: Secondary | ICD-10-CM | POA: Insufficient documentation

## 2013-01-08 HISTORY — PX: ABDOMINAL HYSTERECTOMY: SHX81

## 2013-01-08 SURGERY — HYSTERECTOMY, ABDOMINAL
Anesthesia: General | Site: Abdomen | Wound class: Clean Contaminated

## 2013-01-08 MED ORDER — SCOPOLAMINE 1 MG/3DAYS TD PT72
MEDICATED_PATCH | TRANSDERMAL | Status: AC
  Administered 2013-01-08: 1.5 mg via TRANSDERMAL
  Filled 2013-01-08: qty 1

## 2013-01-08 MED ORDER — SODIUM CHLORIDE 0.9 % IJ SOLN
INTRAMUSCULAR | Status: DC | PRN
  Administered 2013-01-08: 30 mL

## 2013-01-08 MED ORDER — NEOSTIGMINE METHYLSULFATE 1 MG/ML IJ SOLN
INTRAMUSCULAR | Status: DC | PRN
  Administered 2013-01-08: 4 mg via INTRAVENOUS

## 2013-01-08 MED ORDER — KETOROLAC TROMETHAMINE 30 MG/ML IJ SOLN
30.0000 mg | Freq: Four times a day (QID) | INTRAMUSCULAR | Status: DC
  Administered 2013-01-08 – 2013-01-09 (×3): 30 mg via INTRAVENOUS
  Filled 2013-01-08 (×3): qty 1

## 2013-01-08 MED ORDER — OXYCODONE-ACETAMINOPHEN 5-325 MG PO TABS
2.0000 | ORAL_TABLET | ORAL | Status: DC | PRN
  Administered 2013-01-09: 2 via ORAL
  Filled 2013-01-08: qty 2

## 2013-01-08 MED ORDER — MIDAZOLAM HCL 2 MG/2ML IJ SOLN
INTRAMUSCULAR | Status: AC
  Filled 2013-01-08: qty 2

## 2013-01-08 MED ORDER — ACETAMINOPHEN 10 MG/ML IV SOLN
1000.0000 mg | Freq: Four times a day (QID) | INTRAVENOUS | Status: DC
  Administered 2013-01-08: 1000 mg via INTRAVENOUS

## 2013-01-08 MED ORDER — HYDROMORPHONE HCL PF 1 MG/ML IJ SOLN
INTRAMUSCULAR | Status: AC
  Filled 2013-01-08: qty 1

## 2013-01-08 MED ORDER — METOCLOPRAMIDE HCL 5 MG/ML IJ SOLN: 10.0000 mg | Freq: Once | INTRAMUSCULAR | Status: DC | PRN

## 2013-01-08 MED ORDER — MORPHINE SULFATE (PF) 1 MG/ML IV SOLN
INTRAVENOUS | Status: DC
  Administered 2013-01-08: 4.5 mg via INTRAVENOUS
  Administered 2013-01-08: 13.5 mg via INTRAVENOUS
  Administered 2013-01-08: 9 mg via INTRAVENOUS
  Administered 2013-01-09: 4.5 mg via INTRAVENOUS
  Administered 2013-01-09: 3 mg via INTRAVENOUS
  Filled 2013-01-08 (×2): qty 25

## 2013-01-08 MED ORDER — ACETAMINOPHEN 10 MG/ML IV SOLN
INTRAVENOUS | Status: AC
  Filled 2013-01-08: qty 100

## 2013-01-08 MED ORDER — LIDOCAINE HCL (CARDIAC) 20 MG/ML IV SOLN
INTRAVENOUS | Status: DC | PRN
  Administered 2013-01-08: 80 mg via INTRAVENOUS

## 2013-01-08 MED ORDER — DEXTROSE-NACL 5-0.9 % IV SOLN
INTRAVENOUS | Status: DC
  Administered 2013-01-08 – 2013-01-09 (×2): via INTRAVENOUS

## 2013-01-08 MED ORDER — GLYCOPYRROLATE 0.2 MG/ML IJ SOLN
INTRAMUSCULAR | Status: AC
  Filled 2013-01-08: qty 1

## 2013-01-08 MED ORDER — KETOROLAC TROMETHAMINE 30 MG/ML IJ SOLN
INTRAMUSCULAR | Status: DC | PRN
  Administered 2013-01-08: 30 mg via INTRAVENOUS

## 2013-01-08 MED ORDER — DEXTROSE-NACL 5-0.9 % IV SOLN
INTRAVENOUS | Status: DC
  Administered 2013-01-08: 12:00:00 via INTRAVENOUS

## 2013-01-08 MED ORDER — MIDAZOLAM HCL 5 MG/5ML IJ SOLN
INTRAMUSCULAR | Status: DC | PRN
  Administered 2013-01-08: 2 mg via INTRAVENOUS

## 2013-01-08 MED ORDER — MEPERIDINE HCL 25 MG/ML IJ SOLN: 6.2500 mg | INTRAMUSCULAR | Status: DC | PRN

## 2013-01-08 MED ORDER — LACTATED RINGERS IV SOLN
INTRAVENOUS | Status: DC
  Administered 2013-01-08: 06:00:00 via INTRAVENOUS

## 2013-01-08 MED ORDER — VASOPRESSIN 20 UNIT/ML IJ SOLN: INTRAVENOUS | Status: DC | PRN

## 2013-01-08 MED ORDER — FENTANYL CITRATE 0.05 MG/ML IJ SOLN
INTRAMUSCULAR | Status: AC
  Filled 2013-01-08: qty 5

## 2013-01-08 MED ORDER — BUPIVACAINE LIPOSOME 1.3 % IJ SUSP
20.0000 mL | Freq: Once | INTRAMUSCULAR | Status: AC
  Administered 2013-01-08: 29 mL
  Filled 2013-01-08: qty 20

## 2013-01-08 MED ORDER — PROPOFOL 10 MG/ML IV EMUL
INTRAVENOUS | Status: DC | PRN
  Administered 2013-01-08: 200 mg via INTRAVENOUS

## 2013-01-08 MED ORDER — DEXAMETHASONE SODIUM PHOSPHATE 10 MG/ML IJ SOLN
INTRAMUSCULAR | Status: AC
  Filled 2013-01-08: qty 1

## 2013-01-08 MED ORDER — SCOPOLAMINE 1 MG/3DAYS TD PT72: 1.0000 | MEDICATED_PATCH | TRANSDERMAL | Status: DC

## 2013-01-08 MED ORDER — 0.9 % SODIUM CHLORIDE (POUR BTL) OPTIME
TOPICAL | Status: DC | PRN
  Administered 2013-01-08: 1000 mL

## 2013-01-08 MED ORDER — ONDANSETRON HCL 4 MG/2ML IJ SOLN
INTRAMUSCULAR | Status: AC
  Filled 2013-01-08: qty 2

## 2013-01-08 MED ORDER — KETOROLAC TROMETHAMINE 30 MG/ML IJ SOLN
30.0000 mg | Freq: Four times a day (QID) | INTRAMUSCULAR | Status: DC
  Filled 2013-01-08: qty 1

## 2013-01-08 MED ORDER — NALOXONE HCL 0.4 MG/ML IJ SOLN: 0.4000 mg | INTRAMUSCULAR | Status: DC | PRN

## 2013-01-08 MED ORDER — VASOPRESSIN 20 UNIT/ML IJ SOLN
INTRAMUSCULAR | Status: AC
  Filled 2013-01-08: qty 1

## 2013-01-08 MED ORDER — LIDOCAINE HCL (CARDIAC) 20 MG/ML IV SOLN
INTRAVENOUS | Status: AC
  Filled 2013-01-08: qty 5

## 2013-01-08 MED ORDER — NEOSTIGMINE METHYLSULFATE 1 MG/ML IJ SOLN
INTRAMUSCULAR | Status: AC
  Filled 2013-01-08: qty 1

## 2013-01-08 MED ORDER — ONDANSETRON HCL 4 MG/2ML IJ SOLN
INTRAMUSCULAR | Status: DC | PRN
  Administered 2013-01-08: 4 mg via INTRAVENOUS

## 2013-01-08 MED ORDER — PHENYLEPHRINE 40 MCG/ML (10ML) SYRINGE FOR IV PUSH (FOR BLOOD PRESSURE SUPPORT)
PREFILLED_SYRINGE | INTRAVENOUS | Status: AC
  Filled 2013-01-08: qty 5

## 2013-01-08 MED ORDER — GLYCOPYRROLATE 0.2 MG/ML IJ SOLN
INTRAMUSCULAR | Status: DC | PRN
  Administered 2013-01-08: 0.6 mg via INTRAVENOUS
  Administered 2013-01-08: 0.2 mg via INTRAVENOUS

## 2013-01-08 MED ORDER — ROCURONIUM BROMIDE 100 MG/10ML IV SOLN
INTRAVENOUS | Status: DC | PRN
  Administered 2013-01-08: 30 mg via INTRAVENOUS

## 2013-01-08 MED ORDER — PHENYLEPHRINE HCL 10 MG/ML IJ SOLN
INTRAMUSCULAR | Status: DC | PRN
  Administered 2013-01-08 (×2): 40 ug via INTRAVENOUS

## 2013-01-08 MED ORDER — PROPOFOL 10 MG/ML IV EMUL
INTRAVENOUS | Status: AC
  Filled 2013-01-08: qty 20

## 2013-01-08 MED ORDER — HYDROMORPHONE HCL PF 1 MG/ML IJ SOLN
0.2500 mg | INTRAMUSCULAR | Status: DC | PRN
  Administered 2013-01-08: 0.25 mg via INTRAVENOUS
  Administered 2013-01-08: 0.5 mg via INTRAVENOUS
  Administered 2013-01-08: 0.25 mg via INTRAVENOUS
  Administered 2013-01-08: 0.5 mg via INTRAVENOUS
  Administered 2013-01-08: 0.25 mg via INTRAVENOUS

## 2013-01-08 MED ORDER — FENTANYL CITRATE 0.05 MG/ML IJ SOLN
INTRAMUSCULAR | Status: DC | PRN
  Administered 2013-01-08 (×3): 50 ug via INTRAVENOUS
  Administered 2013-01-08: 100 ug via INTRAVENOUS
  Administered 2013-01-08 (×2): 50 ug via INTRAVENOUS

## 2013-01-08 MED ORDER — DIPHENHYDRAMINE HCL 50 MG/ML IJ SOLN: 12.5000 mg | Freq: Four times a day (QID) | INTRAMUSCULAR | Status: DC | PRN

## 2013-01-08 MED ORDER — DIPHENHYDRAMINE HCL 12.5 MG/5ML PO ELIX: 12.5000 mg | ORAL_SOLUTION | Freq: Four times a day (QID) | ORAL | Status: DC | PRN

## 2013-01-08 MED ORDER — LACTATED RINGERS IV SOLN
INTRAVENOUS | Status: DC | PRN
  Administered 2013-01-08 (×2): via INTRAVENOUS

## 2013-01-08 MED ORDER — ONDANSETRON HCL 4 MG/2ML IJ SOLN: 4.0000 mg | Freq: Four times a day (QID) | INTRAMUSCULAR | Status: DC | PRN

## 2013-01-08 MED ORDER — FENTANYL CITRATE 0.05 MG/ML IJ SOLN
INTRAMUSCULAR | Status: AC
  Filled 2013-01-08: qty 2

## 2013-01-08 MED ORDER — SODIUM CHLORIDE 0.9 % IJ SOLN: 9.0000 mL | INTRAMUSCULAR | Status: DC | PRN

## 2013-01-08 MED ORDER — ROCURONIUM BROMIDE 50 MG/5ML IV SOLN
INTRAVENOUS | Status: AC
  Filled 2013-01-08: qty 1

## 2013-01-08 MED ORDER — DEXAMETHASONE SODIUM PHOSPHATE 4 MG/ML IJ SOLN
INTRAMUSCULAR | Status: DC | PRN
  Administered 2013-01-08: 10 mg via INTRAVENOUS

## 2013-01-08 SURGICAL SUPPLY — 47 items
APL SKNCLS STERI-STRIP NONHPOA (GAUZE/BANDAGES/DRESSINGS) ×1
BARRIER ADHS 3X4 INTERCEED (GAUZE/BANDAGES/DRESSINGS) IMPLANT
BENZOIN TINCTURE PRP APPL 2/3 (GAUZE/BANDAGES/DRESSINGS) ×1 IMPLANT
BRR ADH 4X3 ABS CNTRL BYND (GAUZE/BANDAGES/DRESSINGS)
CANISTER SUCTION 2500CC (MISCELLANEOUS) ×2 IMPLANT
CLOTH BEACON ORANGE TIMEOUT ST (SAFETY) ×2 IMPLANT
CONT PATH 16OZ SNAP LID 3702 (MISCELLANEOUS) ×2 IMPLANT
CONT SPECI 4OZ STER CLIK (MISCELLANEOUS) ×1 IMPLANT
DECANTER SPIKE VIAL GLASS SM (MISCELLANEOUS) ×4 IMPLANT
DRESSING TELFA 8X3 (GAUZE/BANDAGES/DRESSINGS) IMPLANT
GAUZE SPONGE 4X4 16PLY XRAY LF (GAUZE/BANDAGES/DRESSINGS) ×1 IMPLANT
GLOVE BIO SURGEON STRL SZ7.5 (GLOVE) ×2 IMPLANT
GLOVE ECLIPSE 7.5 STRL STRAW (GLOVE) ×2 IMPLANT
GLOVE INDICATOR 8.0 STRL GRN (GLOVE) ×2 IMPLANT
GOWN STRL NON-REIN LRG LVL3 (GOWN DISPOSABLE) ×3 IMPLANT
GOWN STRL REIN XL XLG (GOWN DISPOSABLE) ×4 IMPLANT
NDL HYPO 18GX1.5 BLUNT FILL (NEEDLE) IMPLANT
NDL HYPO 25X1 1.5 SAFETY (NEEDLE) ×1 IMPLANT
NDL SAFETY ECLIPSE 18X1.5 (NEEDLE) IMPLANT
NEEDLE HYPO 18GX1.5 BLUNT FILL (NEEDLE) ×2 IMPLANT
NEEDLE HYPO 18GX1.5 SHARP (NEEDLE) ×2
NEEDLE HYPO 25X1 1.5 SAFETY (NEEDLE) ×2 IMPLANT
NS IRRIG 1000ML POUR BTL (IV SOLUTION) ×4 IMPLANT
PACK ABDOMINAL GYN (CUSTOM PROCEDURE TRAY) ×2 IMPLANT
PAD OB MATERNITY 4.3X12.25 (PERSONAL CARE ITEMS) ×2 IMPLANT
PROTECTOR NERVE ULNAR (MISCELLANEOUS) ×2 IMPLANT
SPONGE LAP 18X18 X RAY DECT (DISPOSABLE) ×5 IMPLANT
STAPLER VISISTAT 35W (STAPLE) ×1 IMPLANT
STRIP CLOSURE SKIN 1/2X4 (GAUZE/BANDAGES/DRESSINGS) ×1 IMPLANT
SUT CHROMIC 3 0 TIES (SUTURE) IMPLANT
SUT SILK 3 0 SH 30 (SUTURE) IMPLANT
SUT VIC AB 0 CT1 18XCR BRD8 (SUTURE) ×3 IMPLANT
SUT VIC AB 0 CT1 27 (SUTURE) ×6
SUT VIC AB 0 CT1 27XBRD ANBCTR (SUTURE) ×3 IMPLANT
SUT VIC AB 0 CT1 8-18 (SUTURE) ×6
SUT VIC AB 2-0 CT1 27 (SUTURE)
SUT VIC AB 2-0 CT1 TAPERPNT 27 (SUTURE) IMPLANT
SUT VIC AB 2-0 SH 27 (SUTURE)
SUT VIC AB 2-0 SH 27XBRD (SUTURE) IMPLANT
SUT VIC AB 4-0 KS 27 (SUTURE) ×1 IMPLANT
SUT VICRYL 0 TIES 12 18 (SUTURE) ×2 IMPLANT
SYR 20CC LL (SYRINGE) ×1 IMPLANT
SYR CONTROL 10ML LL (SYRINGE) ×3 IMPLANT
SYR TB 1ML 25GX5/8 (SYRINGE) ×1 IMPLANT
TOWEL OR 17X24 6PK STRL BLUE (TOWEL DISPOSABLE) ×4 IMPLANT
TRAY FOLEY CATH 14FR (SET/KITS/TRAYS/PACK) ×2 IMPLANT
WATER STERILE IRR 1000ML POUR (IV SOLUTION) ×2 IMPLANT

## 2013-01-08 NOTE — Transfer of Care (Signed)
2Immediate Anesthesia Transfer of Care Note  Patient: Victoria Schmitt  Procedure(s) Performed: Procedure(s): HYSTERECTOMY ABDOMINAL (N/A)  Patient Location: PACU  Anesthesia Type:General  Level of Consciousness: awake and sedated  Airway & Oxygen Therapy: Patient Spontanous Breathing and Patient connected to nasal cannula oxygen  Post-op Assessment: Report given to PACU RN and Post -op Vital signs reviewed and stable  Post vital signs: Reviewed and stable  Complications: No apparent anesthesia complications

## 2013-01-08 NOTE — Op Note (Signed)
Victoria Schmitt 09-02-1971 [id number]   Post Operative Note   Date of surgery:  01/08/2013  Pre Op Dx:  Leiomyoma, menorrhagia, anemia, pelvic pain  Post Op Dx:  Leiomyoma, menorrhagia, anemia, pelvic pain, endometriosis  Procedure:  Total abdominal hysterectomy, fulguration of endometriosis  Surgeon:  Dara Lords  Assistant:  Reynaldo Minium  Anesthesia:  General  EBL:  800 cc  Complications:  None  Specimen:  Uterus to pathology with clinical weight 1183 g  Findings: EUA:  Abdomen with palpable abdominopelvic mass above the umbilicus   Operative:  Large uterus with multiple leiomyoma. Right and left ovaries grossly normal free and mobile. Left fallopian tube segment normal. Right fallopian tube segment with several classic powder burn endometriotic implants, fulgurated. No other evidence of pelvic endometriosis. Upper abdominal exam showed appendix grossly normal free and mobile. Liver palpably smooth without abnormality.  Procedure:  The patient was taken to the operating room, underwent general anesthesia, received an abdominal, vaginal, perineal preparation with antiseptic solution, Foley catheter placed in sterile technique and the patient was draped in the usual fashion. A timeout was performed by the surgical team. The abdomen was sharply entered through a Pfannenstiel incision achieving adequate hemostasis at all levels.  The uterus was delivered through the incision, the pelvis was inspected and the upper abdominal exam was performed and the intestines were packed from the pelvis with a tail sponge and the right and left uterine ovarian pedicles were doubly clamped cut and doubly ligated using 0 Vicryl suture. The right and left round ligaments were transected with electrocautery and the anterior vesicouterine peritoneal fold was sharply incised and the bladder flap was sharply and bluntly developed without difficulty. Due to the great bulk of the uterus and vascularity the  uterine vessels at the level of the lower uterine segment/cervical junction were skeletonized doubly clamped cut and ligated using 0 Vicryl suture and the uterus was amputated from the cervix to improve visualization and control bleeding. A Balfour retractor was placed within the incision. The cervical stump was elevated with Coker clamps and the bladder flap was continually developed using sharp and blunt dissection. The paracervical tissues/cardinal ligaments were progressively clamped cut and ligated using 0 Vicryl suture to the level load the cervix and the upper vagina was then crossclamped and the cervical stump was excised from the vagina intact. Right and left angle sutures were placed and tagged for future reference. The vaginal cuff was then run using 0 Vicryl suture in a running interlocking stitch and subsequently the vagina was closed anterior to posterior with several figure-of-eight interrupted stitches. The pelvis was copiously irrigated and several small bleeding points addressed with electrocautery. The endometriosis on the right fallopian tube was fulgurated using electrocautery. The bowel packing and Balfour were removed and the anterior fascia reapproximated using 0 Vicryl suture in a running stitch starting at the angle and meeting in the middle. Exparel was injected in the peri-incisional subcutaneous tissues and the skin was subsequently reapproximated using 4-0 Vicryl in a running subcuticular stitch. Steri-Strips and benzoin were applied as well as a pressure dressing. The patient received intraoperative Toradol, was awakened without difficulty and taken to the recovery room in good condition having tolerated the procedure well.   Dara Lords MD, 9:37 AM 01/08/2013

## 2013-01-08 NOTE — Anesthesia Postprocedure Evaluation (Signed)
  Anesthesia Post-op Note  Anesthesia Post Note  Patient: Victoria Schmitt  Procedure(s) Performed: Procedure(s) (LRB): HYSTERECTOMY ABDOMINAL (N/A)  Anesthesia type: General  Patient location: PACU  Post pain: Pain level controlled  Post assessment: Post-op Vital signs reviewed  Last Vitals:  Filed Vitals:   01/08/13 1130  BP:   Pulse:   Temp: 36.4 C  Resp:     Post vital signs: Reviewed  Level of consciousness: sedated  Complications: No apparent anesthesia complications

## 2013-01-08 NOTE — Progress Notes (Signed)
Shanecia Hoganson 01/31/1971 [id number]   Day of Surgery s/p Procedure(s): HYSTERECTOMY ABDOMINAL  Subjective: Awake, alert.  A lot of pain earlier, doing better now. Objective: Vital signs in last 24 hours: Temp:  [97.3 F (36.3 C)-98.2 F (36.8 C)] 97.3 F (36.3 C) (04/29 1156) Pulse Rate:  [50-63] 63 (04/29 1156) Resp:  [9-18] 13 (04/29 1156) BP: (101-145)/(68-80) 125/79 mmHg (04/29 1156) SpO2:  [97 %-100 %] 100 % (04/29 1156) Last BM Date: 01/06/13  EXAM General: awake, alert and mild distress Resp: rhonchi clear to auscultation bilaterally Cardio: regular rate and rhythm GI: soft, mild-moderate tender, dressing dry intact Lower Extremities: Without swelling or tenderness   Assessment: s/p Procedure(s): HYSTERECTOMY ABDOMINAL: stable, using PCA with good relief of pain  Plan: Continue routine post operative care, advance as tolerated.  LOS: 0 days    Dara Lords, MD 01/08/2013 1:42 PM

## 2013-01-08 NOTE — H&P (Signed)
  The patient was examined.  I reviewed the proposed surgery and consent form with the patient.  The dictated history and physical is current and accurate and all questions were answered. The patient is ready to proceed with surgery and has a realistic understanding and expectation for the outcome.   Dara Lords MD, 6:59 AM 01/08/2013

## 2013-01-08 NOTE — Anesthesia Procedure Notes (Signed)
Procedure Name: Intubation Date/Time: 01/08/2013 7:28 AM Performed by: Eugene Isadore, Jannet Askew Pre-anesthesia Checklist: Patient identified, Timeout performed, Emergency Drugs available and Suction available Patient Re-evaluated:Patient Re-evaluated prior to inductionOxygen Delivery Method: Circle system utilized Preoxygenation: Pre-oxygenation with 100% oxygen Intubation Type: IV induction Ventilation: Mask ventilation without difficulty Laryngoscope Size: Mac and 3 Grade View: Grade I Tube type: Oral Tube size: 7.0 mm Number of attempts: 1 Placement Confirmation: ETT inserted through vocal cords under direct vision,  breath sounds checked- equal and bilateral and positive ETCO2 Secured at: 21 cm Tube secured with: Tape Dental Injury: Teeth and Oropharynx as per pre-operative assessment

## 2013-01-08 NOTE — Anesthesia Postprocedure Evaluation (Signed)
  Anesthesia Post-op Note  Patient: Victoria Schmitt  Procedure(s) Performed: Procedure(s): HYSTERECTOMY ABDOMINAL (N/A)  Patient Location: [facility]  Anesthesia Type:General  Level of Consciousness: awake, alert  and oriented  Airway and Oxygen Therapy: Patient Spontanous Breathing and Patient connected to nasal cannula oxygen  Post-op Pain: mild  Post-op Assessment: Post-op Vital signs reviewed and Patient's Cardiovascular Status Stable  Post-op Vital Signs: Reviewed and stable  Complications: No apparent anesthesia complications

## 2013-01-09 ENCOUNTER — Encounter (HOSPITAL_COMMUNITY): Payer: Self-pay | Admitting: Gynecology

## 2013-01-09 LAB — CBC
MCHC: 33.4 g/dL (ref 30.0–36.0)
Platelets: 109 10*3/uL — ABNORMAL LOW (ref 150–400)
RDW: 13.2 % (ref 11.5–15.5)
WBC: 14.3 10*3/uL — ABNORMAL HIGH (ref 4.0–10.5)

## 2013-01-09 MED ORDER — OXYCODONE-ACETAMINOPHEN 5-325 MG PO TABS: 2.0000 | ORAL_TABLET | ORAL | Status: DC | PRN

## 2013-01-09 NOTE — Progress Notes (Signed)
Pt is discharged in the care of husband, Downstairs per ambulatory. Stable. Abdominal incision is clean  And dry. Denies any pain or discomfort.Only scanty amt of  Vaginal bleeding noted on V-pad. Discharged instructions given with good understanding . Questions asked and answered. Stable.

## 2013-01-09 NOTE — Progress Notes (Signed)
Patient ID: Victoria Schmitt, female   DOB: 1971/05/20, 42 y.o.   MRN: [id number] Victoria Schmitt Nov 21, 1970 [id number]   1 Day Post-Op s/p Procedure(s): HYSTERECTOMY ABDOMINAL  Subjective: Patient reports no acute distress, pain severity reported a 4 on a scale of 0-10, yes taking PO, foley catheter out, yesvoiding, yes ambulating, no passing flatus  Objective: Vital signs in last 24 hours: Temp:  [97.3 F (36.3 C)-97.9 F (36.6 C)] 97.5 F (36.4 C) (04/30 0550) Pulse Rate:  [50-80] 70 (04/30 0550) Resp:  [8-18] 13 (04/30 0550) BP: (115-147)/(69-84) 133/84 mmHg (04/30 0550) SpO2:  [97 %-100 %] 100 % (04/30 0550) Weight:  [130 lb (58.968 kg)] 130 lb (58.968 kg) (04/29 1300) Last BM Date:  (pt states day before she came into hospital)  EXAM General: awake, no distress Resp: rhonchi clear to auscultation bilaterally Cardio: regular rate and rhythm GI: soft, mildly tender, bowel sounds active, incisions dry intact Lower Extremities: Without swelling or tenderness Vaginal Bleeding: Reported moderate  Lab Results:   Recent Labs  01/09/13 0550  WBC 14.3*  HGB 10.6*  HCT 31.7*  PLT 109*    Assessment: s/p Procedure(s): HYSTERECTOMY ABDOMINAL: stable and progressing well.  Hb 10.6.  Ambulating, eating, voiding.  Adequate pain relief  Plan: Continue routine post operative care, Advance diet Encourage ambulation Advance to PO medication Discontinue IV fluids Consider discharge later today if continues well and patient desires.  Reassess later today.  LOS: 1 day    Dara Lords, MD 01/09/2013 8:30 AM

## 2013-01-09 NOTE — Progress Notes (Signed)
Victoria Schmitt 1971/04/22 [id number]   1 Day Post-Op Procedure(s) (LRB): HYSTERECTOMY ABDOMINAL (N/A)  Subjective: Patient reports feels well, no acute distress, pain severity reported mild, yes taking PO, foley catheter out, yes voiding, yes ambulating, yes passing flatus  Objective: Vital signs in last 24 hours: Temp:  [97.5 F (36.4 C)-97.8 F (36.6 C)] 97.7 F (36.5 C) (04/30 1000) Pulse Rate:  [52-80] 60 (04/30 1000) Resp:  [8-16] 16 (04/30 1000) BP: (109-147)/(70-84) 109/72 mmHg (04/30 1000) SpO2:  [100 %] 100 % (04/30 1000) Last BM Date:  (pt states day before she came into hospital)    EXAM General: awake, no distress Resp: rhonchi clear to auscultation bilaterally Cardio: regular rate and rhythm, S1, S2 normal, no murmur, click, rub or gallop GI: soft, minimal tender, bowel sounds active, incisions dry intact Lower Extremities: Without swelling or tenderness Vaginal Bleeding: Reported scant   Lab Results:   Recent Labs  01/09/13 0550  WBC 14.3*  HGB 10.6*  HCT 31.7*  PLT 109*    Assessment: s/p Procedure(s): HYSTERECTOMY ABDOMINAL: progressing well, eating, ambulating, voiding with good pain relief. Plan: Discussed options of discharge now vs tomorrow am.  Patient chooses discharge now.  Precautions, instructions and follow up were discussed with the patient and husband.  Prescriptions provided per AVS.  Patient to call the office to arrange a post-operative appointmant in 2 weeks.  Dara Lords, MD 01/09/2013 1:54 PM

## 2013-01-09 NOTE — Progress Notes (Signed)
Ur chart review completed.  

## 2013-01-10 NOTE — Discharge Summary (Signed)
  Ericia Moxley Dec 12, 1970 [id number]   Discharge Summary  Date of Admission:  01/08/2013  Date of Discharge:  01/09/2013  Discharge Diagnosis:  Leiomyomata, endometrial polyp, adenomyosis, menorrhagia, anemia, pelvic pain  Procedure:  Procedure(s): HYSTERECTOMY ABDOMINAL  Pathology: THOLOGY Diagnosis Uterus and cervix - CERVIX: UNREMARKABLE. - ENDOMETRIUM: INACTIVE WITH BENIGN ENDOMETRIAL POLYP. NO HYPERPLASIA OR CARCINOMA. - MYOMETRIUM: ADENOMYOSIS. LEIOMYOMATA. - UTERINE SEROSA: UNREMARKABLE. NO ENDOMETRIOSIS OR MALIGNANCY  Hospital Course:  Patient underwent an uncomplicated TAH 01/08/2013. Her postoperative course was uncomplicated she was discharged on postoperative day #1 ambulating well, eating a regular diet, voiding without difficulty, passing flatus, good pain relief with a postoperative hemoglobin of 10.6. The patient received instructions for postoperative care and call precautions.  She received prescriptions per AVS and will be seen in the office 2 weeks following discharge.       Dara Lords MD, 11:50 AM 01/10/2013

## 2013-01-14 ENCOUNTER — Telehealth: Payer: Self-pay

## 2013-01-14 ENCOUNTER — Ambulatory Visit: Payer: BC Managed Care – PPO | Admitting: Gynecology

## 2013-01-14 ENCOUNTER — Inpatient Hospital Stay (HOSPITAL_COMMUNITY)
Admission: AD | Admit: 2013-01-14 | Discharge: 2013-01-14 | Disposition: A | Discharge status: Home / Self Care | Payer: BC Managed Care – PPO | Source: Ambulatory Visit | Attending: Obstetrics & Gynecology | Admitting: Obstetrics & Gynecology

## 2013-01-14 DIAGNOSIS — R141 Gas pain: Secondary | ICD-10-CM

## 2013-01-14 DIAGNOSIS — R109 Unspecified abdominal pain: Secondary | ICD-10-CM | POA: Insufficient documentation

## 2013-01-14 DIAGNOSIS — A599 Trichomoniasis, unspecified: Secondary | ICD-10-CM

## 2013-01-14 DIAGNOSIS — A5901 Trichomonal vulvovaginitis: Secondary | ICD-10-CM | POA: Insufficient documentation

## 2013-01-14 DIAGNOSIS — G8918 Other acute postprocedural pain: Secondary | ICD-10-CM

## 2013-01-14 LAB — URINE MICROSCOPIC-ADD ON

## 2013-01-14 LAB — COMPREHENSIVE METABOLIC PANEL
AST: 15 U/L (ref 0–37)
Albumin: 3.8 g/dL (ref 3.5–5.2)
Chloride: 97 mEq/L (ref 96–112)
Creatinine, Ser: 0.64 mg/dL (ref 0.50–1.10)
Sodium: 137 mEq/L (ref 135–145)
Total Bilirubin: 0.6 mg/dL (ref 0.3–1.2)

## 2013-01-14 LAB — URINALYSIS, ROUTINE W REFLEX MICROSCOPIC
Glucose, UA: NEGATIVE mg/dL
Protein, ur: NEGATIVE mg/dL
Specific Gravity, Urine: 1.015 (ref 1.005–1.030)

## 2013-01-14 LAB — CBC
MCV: 91.8 fL (ref 78.0–100.0)
Platelets: 298 10*3/uL (ref 150–400)
RDW: 13.9 % (ref 11.5–15.5)
WBC: 10 10*3/uL (ref 4.0–10.5)

## 2013-01-14 MED ORDER — SIMETHICONE 80 MG PO CHEW: 80.0000 mg | CHEWABLE_TABLET | Freq: Four times a day (QID) | ORAL | Status: DC | PRN

## 2013-01-14 MED ORDER — METRONIDAZOLE 500 MG PO TABS
2000.0000 mg | ORAL_TABLET | Freq: Once | ORAL | Status: AC
Start: 2013-01-14 — End: 2013-01-14
  Administered 2013-01-14: 2000 mg via ORAL
  Filled 2013-01-14: qty 4

## 2013-01-14 MED ORDER — OXYCODONE-ACETAMINOPHEN 5-325 MG PO TABS: 2.0000 | ORAL_TABLET | ORAL | Status: DC | PRN

## 2013-01-14 MED ORDER — SIMETHICONE 80 MG PO CHEW
160.0000 mg | CHEWABLE_TABLET | Freq: Once | ORAL | Status: AC
  Administered 2013-01-14: 160 mg via ORAL
  Filled 2013-01-14: qty 2

## 2013-01-14 MED ORDER — KETOROLAC TROMETHAMINE 60 MG/2ML IM SOLN
60.0000 mg | Freq: Once | INTRAMUSCULAR | Status: AC
  Administered 2013-01-14: 60 mg via INTRAMUSCULAR
  Filled 2013-01-14: qty 2

## 2013-01-14 NOTE — Telephone Encounter (Signed)
Patient called back in voice mail and said she will be going to [facility].  I called her husband back and he said she is in so much pain that he cannot get her up and he called EMS.  I checked with Dr. Velvet Bathe regarding her symptoms and he said she should go to [facility]. Husband advised.

## 2013-01-14 NOTE — MAU Note (Signed)
Pt states BM this morning was her first BM since surgery.

## 2013-01-14 NOTE — MAU Note (Signed)
Pt had abd hysterectomy on on 4/29, was DC'd home with no problems.  Pt began having abd pain last night, took pain meds & went to bed.  Abd pain is more severe this a.m., had BM this a.m., still having pain.

## 2013-01-14 NOTE — Telephone Encounter (Signed)
Patient had abd hysterectomy last Tuesday.  She called c/o bloating, gas,upper abd pain, burning. Also upon questioning some tingling with urination.  No apparent fever. Checked this morning and it was 96.8 degrees.  She just used her last oxycodone.  Rec OV . Appt made for 10:50am today .

## 2013-01-14 NOTE — ED Provider Notes (Signed)
Victoria Schmitt 02/11/1971 [id number]  Emergency room note  Chief complaint: Abdominal pain   History of present illness: 42 y.o. G2P2 status post TAH 01/08/2013. Patient was discharged on postoperative day #1 doing well. Since then she was doing well up until the last 24 hours when she had increasing abdominal pain described as way like starting in the upper abdomen moving down into the lower abdomen. She's been eating and drinking without difficulty. Voiding frequently without discomfort. Has had one bowel movement this morning since surgery and noted initially felt better but then having a lot of cramping afterwards which led to her presentation to the emergency room. Patient says it feels like labor pains. Has been taking Dulcolax pills 3 times daily drinking prune juice trying to stimulate a bowel movement. Has been checking her temperature at home has been normal having no upper abdominal complaints such as nausea vomiting.  Past medical history,surgical history, medications, allergies, family history and social history were all reviewed and documented in the EPIC chart. ROS:  Was performed and pertinent positives and negatives are included in the history of present illness.  Exam:  With triage assistant Temperature 98.2 blood pressure 122/72 General: well developed, well nourished female, with overall abdominal distress HEENT: normal  Lungs: clear to auscultation without wheezing, rales or rhonchi  Cardiac: regular rate without rubs, murmurs or gallops  Abdomen: soft, active bowel sounds. Tender to light touch throughout all quadrants. No masses guarding rebound. Incision dry and intact with Steri-Strips in place. Pelvic: external bus vagina: Cuff healing nicely, nontender to manipulation Adnexa: without masses or significant tenderness  Rectovaginal exam within normal limits   Lab work Hemoglobin 9.3 (immediate postop 10.6 01/09/2013) WBC 10.0 Comprehensive metabolic panel  normal Urinalysis with slight bacteria, 3-6 WBC, 3-6 RBC. Positive trichomonas noted.   Assessment/Plan:  42 y.o. G2P2 one week postop status post TAH with abdominal pain. Has only had one bowel movement since surgery but has been trying to stimulate this with prune juice, oral laxatives and fruits and vegetables. Eating, drinking, afebrile. I think most of her pain is gas pain and with her bowel movement this morning hopefully this will continue to ease over the next 24 hours. Patient notes that she is feeling better since being in the emergency room. Do not feel at this point more invasive testing such as CT scan necessary. I did discuss with her and her husband more concerning issues such as bowel injury, bladder or ureteral damage, hematoma or early abscess formation. Possible need for CT scan or other testing if she does not show improvement. Again overall has had an uneventful recovery up until the last 24 hours at this point is feeling better today. We'll go ahead and discharge her home with oral pain medication and to treat her trichomonas with instructions to followup by the end of this week in the office. ASAP call precautions reviewed previously in anticipation of discharge today. We'll followup on urine culture.  Dara Lords MD, 1:56 PM 01/14/2013

## 2013-01-14 NOTE — MAU Provider Note (Signed)
History     CSN: [id number]  Arrival date and time: 01/14/13 1058   None     Chief Complaint  Patient presents with  . Abdominal Pain   HPI 42 y.o. G2P2 at 1 week s/p abdominal hyst with worsening abominal pain. States pain feels like movement in upper/mid abdomen, then passes gas and has burning. Had first "real bowel movement" this morning, pain worsened afterwards. Has vaginal spotting, only with wiping. Denies n/v, diarrhea, fever, chills. Took 2 Oxycodone this morning, did not help with pain this morning.    Past Medical History  Diagnosis Date  . Anemia   . Fibroid   . Heart murmur   . 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 Tubal ligation    . Abdominal hysterectomy N/A 01/08/2013    Procedure: HYSTERECTOMY ABDOMINAL;  Surgeon: Dara Lords, MD;  Location: [facility];  Service: Gynecology;  Laterality: N/A;    Family History  Problem Relation Age of Onset  . Diabetes Maternal Grandmother   . Seizures Maternal Grandfather   . Cancer Maternal Aunt   . Cancer Maternal Uncle   . Diabetes Paternal Grandmother   . Diabetes Paternal Grandfather     History  Substance Use Topics  . Smoking status: Never Smoker   . Smokeless tobacco: Never Used  . Alcohol Use: Yes     Comment: occassional    Allergies: No Known Allergies  No prescriptions prior to admission    Review of Systems  Constitutional: Negative.  Negative for fever and chills.  Respiratory: Negative.   Cardiovascular: Negative.   Gastrointestinal: Positive for abdominal pain. Negative for nausea, vomiting, diarrhea and constipation.  Genitourinary: Negative for dysuria, urgency, frequency, hematuria and flank pain.       + spotting   Musculoskeletal: Negative.   Neurological: Negative.   Psychiatric/Behavioral: Negative.     Physical Exam   Blood pressure 105/79, pulse 98, temperature 98.2 F (36.8 C), resp. rate 18, last menstrual period 07/26/2012.  Physical Exam  Nursing note and vitals reviewed. Constitutional: She appears well-developed and well-nourished. She appears distressed.  Cardiovascular: Normal rate.   Respiratory: Effort normal.  GI: Soft. She exhibits no distension. Bowel sounds are increased. There is generalized tenderness. There is no rigidity, no rebound and no guarding.    MAU Course  Procedures  Results for orders placed during the hospital encounter of 01/14/13 (from the past 24 hour(s))  CBC     Status: Abnormal   Collection Time    01/14/13 11:20 AM      Result Value Range   WBC 10.0  4.0 - 10.5 K/uL   RBC 3.06 (*) 3.87 - 5.11 MIL/uL   Hemoglobin 9.3 (*) 12.0 - 15.0 g/dL   HCT 40.9 (*) 81.1 - 91.4 %   MCV 91.8  78.0 - 100.0 fL   MCH 30.4  26.0 - 34.0 pg   MCHC 33.1  30.0 - 36.0 g/dL   RDW 78.2  95.6 - 21.3 %   Platelets 298  150 - 400 K/uL  COMPREHENSIVE METABOLIC PANEL     Status: Abnormal   Collection Time    01/14/13 11:20 AM      Result Value Range   Sodium 137  135 - 145 mEq/L   Potassium 4.0  3.5 - 5.1 mEq/L   Chloride 97  96 - 112 mEq/L   CO2 29  19 - 32 mEq/L   Glucose, Bld 107 (*) 70 - 99 mg/dL   BUN 10  6 - 23 mg/dL   Creatinine, Ser 1.61  0.50 - 1.10 mg/dL   Calcium 9.5  8.4 - 09.6 mg/dL   Total Protein 7.0  6.0 - 8.3 g/dL   Albumin 3.8  3.5 - 5.2 g/dL   AST 15  0 - 37 U/L   ALT 11  0 - 35 U/L   Alkaline Phosphatase 49  39 - 117 U/L   Total Bilirubin 0.6  0.3 - 1.2 mg/dL   GFR calc non Af Amer >90  >90 mL/min   GFR calc Af Amer >90  >90 mL/min  URINALYSIS, ROUTINE W REFLEX MICROSCOPIC     Status: Abnormal   Collection Time    01/14/13 11:35 AM      Result Value Range   Color, Urine YELLOW  YELLOW   APPearance CLEAR  CLEAR   Specific Gravity, Urine 1.015  1.005 - 1.030   pH 8.0  5.0 - 8.0   Glucose, UA NEGATIVE  NEGATIVE mg/dL   Hgb urine  dipstick MODERATE (*) NEGATIVE   Bilirubin Urine NEGATIVE  NEGATIVE   Ketones, ur NEGATIVE  NEGATIVE mg/dL   Protein, ur NEGATIVE  NEGATIVE mg/dL   Urobilinogen, UA 0.2  0.0 - 1.0 mg/dL   Nitrite NEGATIVE  NEGATIVE   Leukocytes, UA SMALL (*) NEGATIVE  URINE MICROSCOPIC-ADD ON     Status: Abnormal   Collection Time    01/14/13 11:35 AM      Result Value Range   Squamous Epithelial / LPF FEW (*) RARE   WBC, UA 3-6  <3 WBC/hpf   RBC / HPF 3-6  <3 RBC/hpf   Bacteria, UA RARE  RARE   Urine-Other TRICHOMONAS PRESENT     Dr. Audie Box to MAU, evaluated pt and preformed pelvic.  Toradol 60 mg IM, Simethicone 180 mg PO - pain improved Trichomonas in urine - treated with Flagyl today  Assessment and Plan   1. Abdominal gas pain   2. Post-operative pain   3. Trichimoniasis   New rx given for Percocet, may take simethicone OTC as needed, f/u by end of the week in office or sooner with worsening sx. Trichomonas treated today.     Medication List    TAKE these medications       ALPRAZolam 0.25 MG tablet  Commonly known as:  XANAX  Take 0.25 mg by mouth every 6 (six) hours.     IRON CR PO  Take 1 tablet by mouth daily.     multivitamin tablet  Take 1 tablet by mouth daily.     oxyCODONE-acetaminophen 5-325 MG per tablet  Commonly known as:  PERCOCET/ROXICET  Take 2 tablets by mouth every 4 (four) hours as needed for pain.     oxyCODONE-acetaminophen 5-325 MG per tablet  Commonly known as:  PERCOCET/ROXICET  Take 2 tablets by mouth every 4 (four) hours as needed.     simethicone 80 MG chewable tablet  Commonly known as:  GAS-X  Chew 1 tablet (80 mg total) by mouth every 6 (six) hours as needed for flatulence.     VITAMIN E PO  Take 2 capsules by mouth daily.            Follow-up Information   Follow up with Dara Lords, MD. Schedule an appointment as soon as possible for a visit on 01/18/2013. (  or before Friday)    Contact information:   [address] [place] [address] [phone]         Victoria Schmitt 01/14/2013, 2:35 PM

## 2013-01-16 ENCOUNTER — Telehealth: Payer: Self-pay

## 2013-01-16 ENCOUNTER — Encounter: Payer: Self-pay | Admitting: Gynecology

## 2013-01-16 ENCOUNTER — Ambulatory Visit (INDEPENDENT_AMBULATORY_CARE_PROVIDER_SITE_OTHER): Payer: BC Managed Care – PPO | Admitting: Gynecology

## 2013-01-16 DIAGNOSIS — N951 Menopausal and female climacteric states: Secondary | ICD-10-CM

## 2013-01-16 DIAGNOSIS — N939 Abnormal uterine and vaginal bleeding, unspecified: Secondary | ICD-10-CM

## 2013-01-16 DIAGNOSIS — N898 Other specified noninflammatory disorders of vagina: Secondary | ICD-10-CM

## 2013-01-16 NOTE — Patient Instructions (Signed)
Post op follow up appt in one week

## 2013-01-16 NOTE — Progress Notes (Signed)
Patient presents one week postop from TAH. Was evaluated last week with acute abdominal pain which ultimately turned out to be gas. Patient notes that her abdominal symptoms have all resolved. She called today to make an appointment for her routine postop check and noted that she was bleeding a little bit and they asked her to come in. She's not having any pain. She is eating drinking voiding and having regular bowel movements.  Exam which can assistant Abdomen soft nontender with incision intact with Steri-Strips. Pelvic external BUS vagina with cuff intact. Slight oozing at the vaginal cuff. No specific bleeding point identified. Bimanual without masses or tenderness.  Assessment and plan: Normal postop check. Slight oozing from the vaginal cuff which is intact. Do not feel trying to treat this at this point with silver nitrate or Monsel's appropriate. We'll monitor and I suspect will resolve spontaneously. She is still having a fair amount of hot flashes as a result of her Depo-Lupron shot. She received this approximately 3-4 weeks ago. I gave her samples of the [place]ivelle 0.05 mg patches #4 apply one every 4 days to see if we can help her in the next 2 weeks until her Depo-Lupron wears off. Patient will follow up in one week for her routine postop check.

## 2013-01-16 NOTE — Telephone Encounter (Signed)
Patient called me stating she needed to schedule her two week post op check. I was going to transfer her to appts but she had another question. She then told me that she was having her period and asked if that was okay. I explained that she no longer has a uterus so this is not a period. She said she is bleeding onto her pad just like her menses.  I recommended office visit and she was scheduled for today.

## 2013-01-22 ENCOUNTER — Encounter: Payer: Self-pay | Admitting: Gynecology

## 2013-01-22 ENCOUNTER — Ambulatory Visit (INDEPENDENT_AMBULATORY_CARE_PROVIDER_SITE_OTHER): Payer: BC Managed Care – PPO | Admitting: Gynecology

## 2013-01-22 DIAGNOSIS — Z09 Encounter for follow-up examination after completed treatment for conditions other than malignant neoplasm: Secondary | ICD-10-CM

## 2013-01-22 MED ORDER — IBUPROFEN 800 MG PO TABS: 800.0000 mg | ORAL_TABLET | Freq: Three times a day (TID) | ORAL | Status: DC | PRN

## 2013-01-22 NOTE — Patient Instructions (Signed)
Follow up in 2 weeks for post op check appt

## 2013-01-22 NOTE — Progress Notes (Signed)
Two-week postoperative visit status post TAH. Noted some spotting on and off. Otherwise doing well as far as voiding without difficulty and having regular bowel movements.  Exam with Kim assistant Abdomen soft nontender without masses guarding rebound organomegaly. Steri-Strips were all removed. Incision healing nicely. Pelvic external BUS vagina with cuff intact. It does appear that the mucosa has separated and is spotting along the incision line. Sutures otherwise in place. Bimanual without masses or tenderness.  Assessment and plan: Postop check status post TAH overall doing well. Upper cuff appears to have separated somewhat but intact. Stressed the need to avoid intercourse or anything in the vagina. Continue with routine postoperative care. Followup in 2 weeks for her next postoperative visit.

## 2013-02-05 ENCOUNTER — Ambulatory Visit (INDEPENDENT_AMBULATORY_CARE_PROVIDER_SITE_OTHER): Payer: BC Managed Care – PPO | Admitting: Gynecology

## 2013-02-05 ENCOUNTER — Encounter: Payer: Self-pay | Admitting: Gynecology

## 2013-02-05 DIAGNOSIS — Z9889 Other specified postprocedural states: Secondary | ICD-10-CM

## 2013-02-05 NOTE — Patient Instructions (Signed)
Slowly increase activity.  Continue with nothing inside the vagina.  Follow up in 2 weeks for postoperative visit.

## 2013-02-05 NOTE — Progress Notes (Signed)
Patient presents for four-week postoperative visit status post TAH. Doing well slowly resuming normal activities with the exception of pelvic rest.  Exam with Selena Batten assistant Abdomen soft with incision healed nicely. Pelvic external BUS vagina with upper cuff granulating in. Bimanual without masses or significant tenderness.  Assessment and plan: 4 weeks status post TAH. Upper cuff appeared to have separated slightly and now granulating in. Sutures visible and intact. Continue with pelvic rest but otherwise slowly resume normal activities. Reexamination in 2 weeks. I reviewed with her the issues of the upper vagina and the probable need to abstain from intercourse for another month. We'll reassess in 2 weeks.

## 2013-02-19 ENCOUNTER — Ambulatory Visit (INDEPENDENT_AMBULATORY_CARE_PROVIDER_SITE_OTHER): Payer: BC Managed Care – PPO | Admitting: Gynecology

## 2013-02-19 ENCOUNTER — Encounter: Payer: Self-pay | Admitting: Gynecology

## 2013-02-19 DIAGNOSIS — Z9889 Other specified postprocedural states: Secondary | ICD-10-CM

## 2013-02-19 NOTE — Progress Notes (Signed)
Patient presents at 6 weeks for her postoperative visit status post TAH. She reports doing well having resumed all normal activities except for intercourse. Ambulating tolerating a regular diet voiding and having bowel movements without difficulty.  Exam with Kim assistant Abdomen soft nontender without masses guarding rebound organomegaly. Incision healed nicely. Pelvic external BUS vagina with cuff small area of granulation tissue 50% less than prior exam. Bimanual without masses or tenderness. Silver nitrate applied to the granulation tissue.  Assessment and plan: 6 weeks status post TAH. Vaginal cuff apparently opened slightly postoperatively. I suspect this was secondary to her issues postop with constipation and straining. Granulating in well and almost closed. I did ask her again to abstain from intercourse and return in 2 weeks and she agrees to do so. Otherwise she can resume all normal activities and return to work as she is feeling well.

## 2013-02-19 NOTE — Patient Instructions (Signed)
Followup in 2 weeks for postoperative check.

## 2013-03-05 ENCOUNTER — Ambulatory Visit (INDEPENDENT_AMBULATORY_CARE_PROVIDER_SITE_OTHER): Payer: BC Managed Care – PPO | Admitting: Gynecology

## 2013-03-05 ENCOUNTER — Encounter: Payer: Self-pay | Admitting: Gynecology

## 2013-03-05 DIAGNOSIS — Z9889 Other specified postprocedural states: Secondary | ICD-10-CM

## 2013-03-05 NOTE — Progress Notes (Signed)
Patient presents for postoperative visit. Status post TAH 01/08/2013. Upper cuff was granulating in. She's resumed all normal activities without problems with the exception of intercourse.  Exam with Kim Asst. Abdomen soft nontender without masses guarding rebound organomegaly. Incision well-healed. Pelvic external BUS vagina cuff well-healed. Bimanual without masses or tenderness.  Assessment and plan: Normal postoperative visit. Patient to resume all normal activities. I did ask her to be very careful with intercourse and start slowly without deep penetration until she is comfortable. Followup in 6 months when she is due for her annual exam.

## 2013-03-05 NOTE — Patient Instructions (Signed)
Slowly resume all normal activities. Followup in 6 months for annual exam.

## 2013-08-07 ENCOUNTER — Encounter: Payer: BC Managed Care – PPO | Admitting: Gynecology

## 2013-11-26 IMAGING — US US PELVIS COMPLETE
1 series · 13 of 25 positions shown · non-contrast
Comparison: None

CLINICAL DATA: Palpable abnormality in the left abdomen/pelvis near
the umbilicus.  LMP 1 month ago.

TRANSABDOMINAL AND TRANSVAGINAL ULTRASOUND OF PELVIS
TECHNIQUE: Both transabdominal and transvaginal ultrasound
examinations of the pelvis were performed. Transabdominal technique
was performed for global imaging of the pelvis including uterus,
ovaries, adnexal regions, and pelvic cul-de-sac.
It was necessary to proceed with endovaginal exam following the
transabdominal exam to visualize the uterus, endometrium, ovaries,
adnexal regions.

[Series 1: us pelvis complete · 0.33mm/px · 13 of 80 slices shown]
[im 1/80]
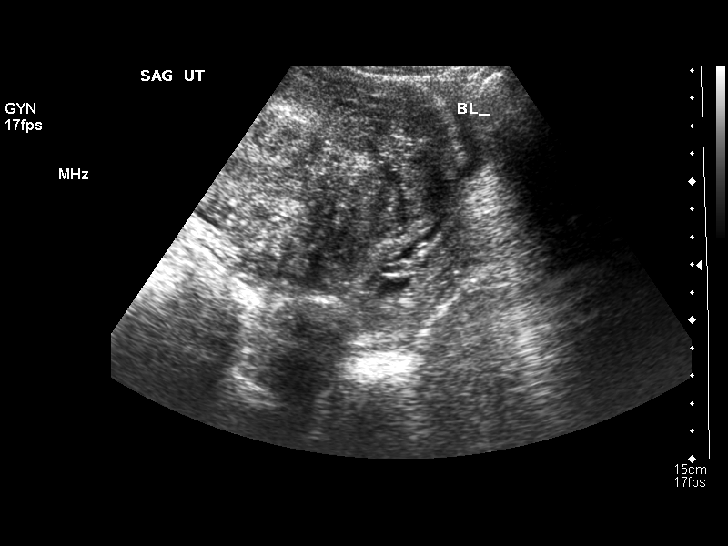
[im 7/80]
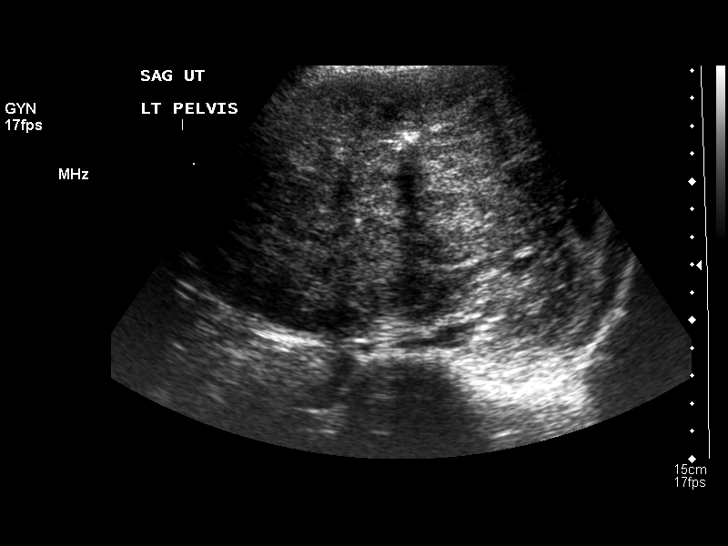
[im 14/80]
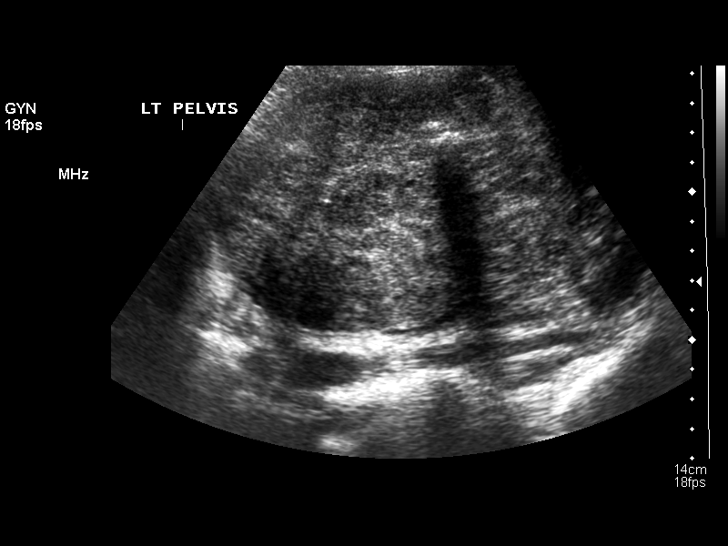
[im 20/80]
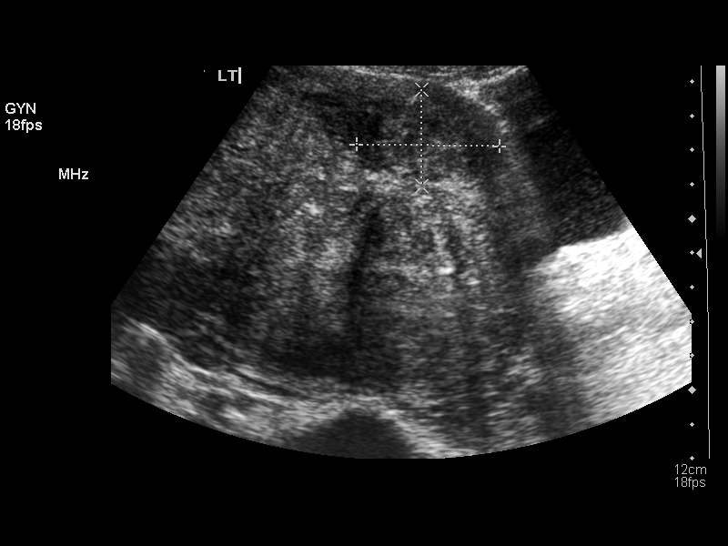
[im 27/80]
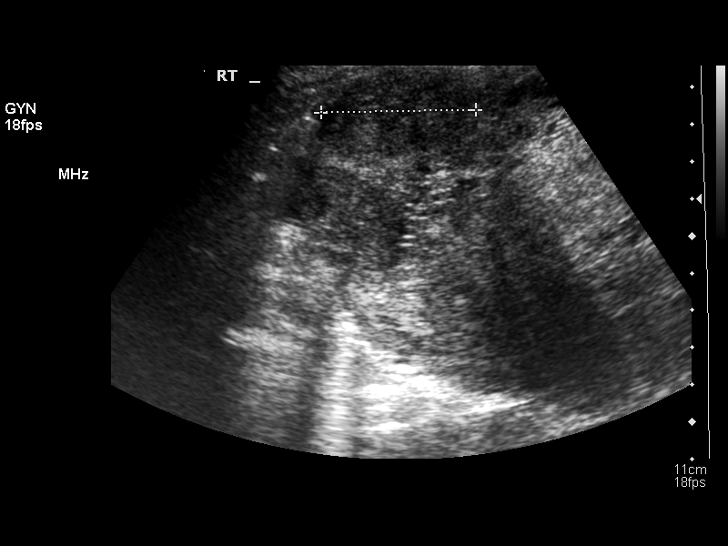
[im 33/80]
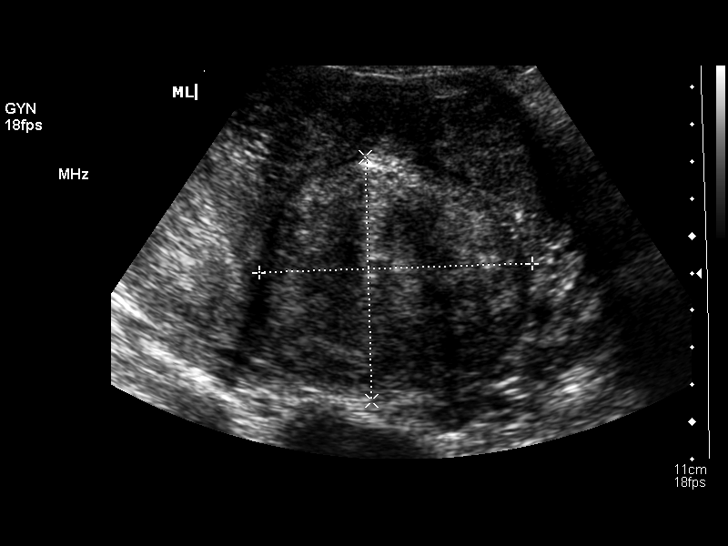
[im 40/80]
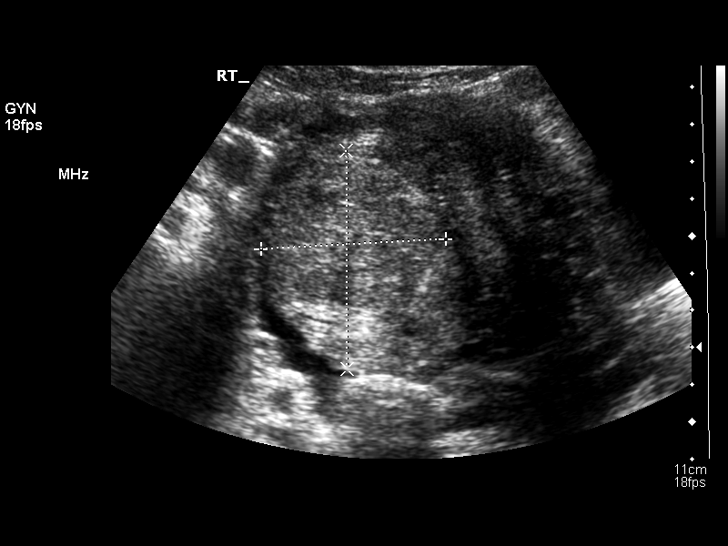
[im 47/80]
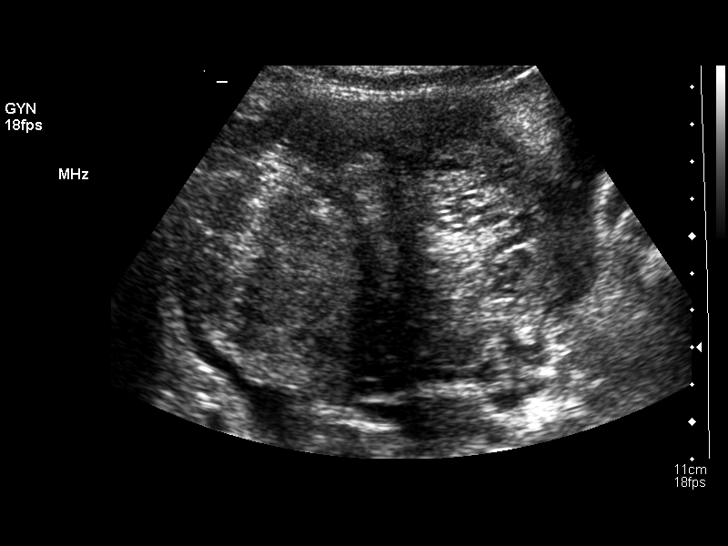
[im 53/80]
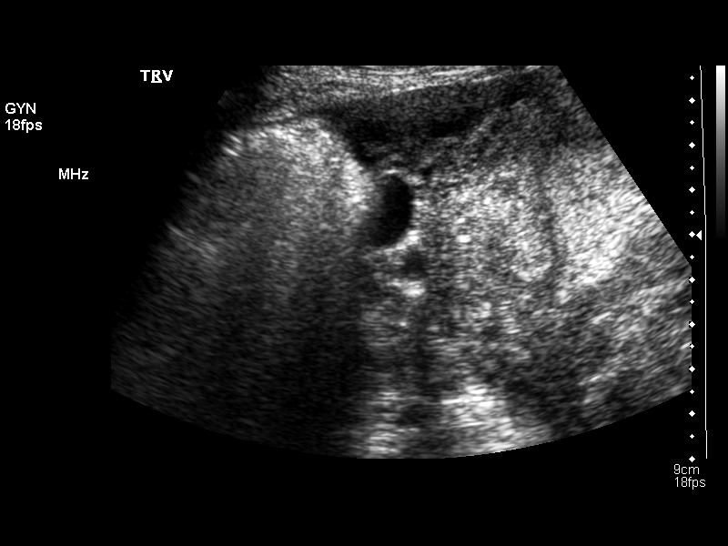
[im 60/80]
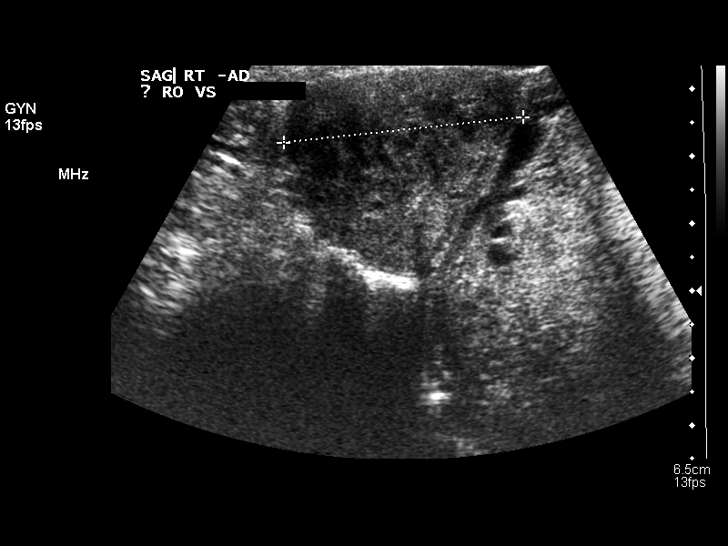
[im 66/80]
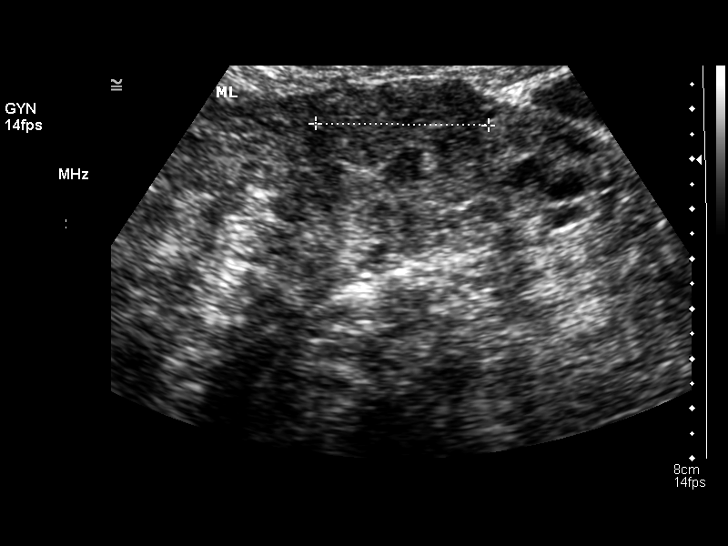
[im 73/80]
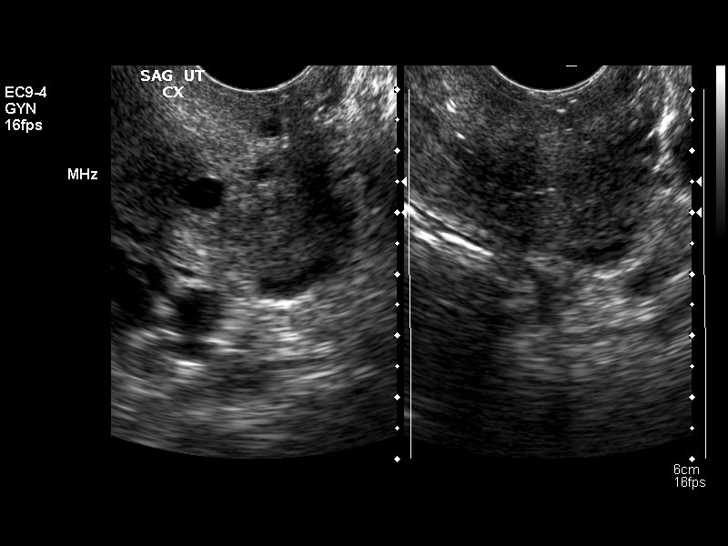
[im 80/80]
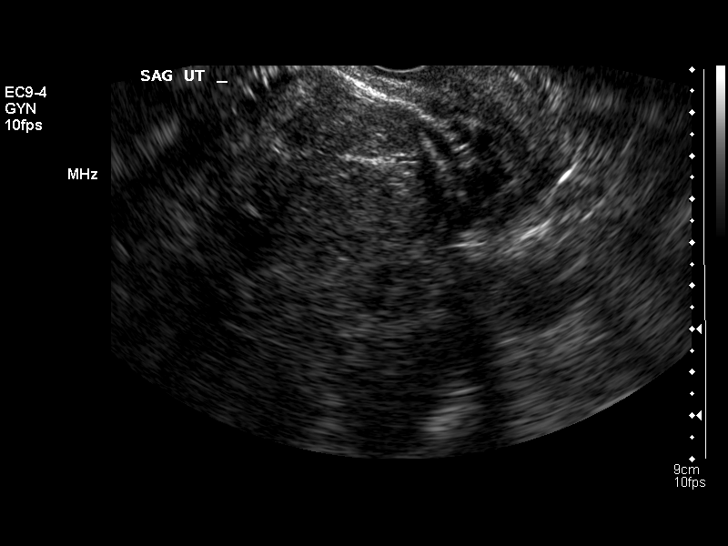

[13 of 25 positions shown; findings below may reference images not displayed]

FINDINGS: Uterus: The uterus is enlarged, measuring 18.5 x 9.3 x 15.4 cm.
Multiple fibroids are identified.
Large left fundal fibroid is 14.1 x 9.6 x 11.6 cm. This fibroid
accounts for the palpable abnormality.
Central fibroid is 7.2 x 6.6 x 8.8 cm.
Left fibroid is 4.2 x 2.8 x 4.2 cm.
Left fibroid is 3.9 x 3.7 x 4.2 cm.
Fundal fibroid is 3.6 x 2.3 x 3.5 cm.
Cervical fibroid is 2.0 x 1.7 x 2.0 cm.
Right fibroid is 5.0 x 5.9 x 5.8 cm.
Right fundal fibroid is 3.1 x 3.0 x 3.6 cm.
Central fibroid is 7.4 x 6.6 x 8.9 cm.

Endometrium: The endometrium is 36.2 mm.  Within the endometrium,
there is a hypoechoic solid nodule measuring 2.1 x 1.8 x 2.2 cm.
Differential diagnosis includes submucosal fibroid or polyp.

Right ovary:  Right ovary is not visualized, likely because of
technical factors related to fibroids.

Left ovary:  Left ovary is not visualized, likely because of
technical factors related to fibroids.

Other findings: There is a small amount of fluid near the uterine
fundus.
IMPRESSION: 1.  Markedly enlarged uterus, containing multiple fibroids.
2. Large left fundal fibroid near the umbilicus accounts for the
palpable abnormality.
3.  Thickened endometrial stripe which contains a 2.1 cm hypoechoic
mass.  Differential diagnosis includes fibroid or polyp.  Consider
endometrial sampling.
4.  Non-visualized ovaries..

## 2013-12-04 ENCOUNTER — Telehealth: Payer: Self-pay

## 2013-12-04 NOTE — Telephone Encounter (Signed)
Patient called stating she needed to get copy of her return to work note from back when she had surgery. I returned her call to find out what exactly she needs from her disability packet I completed for her back then.

## 2013-12-04 NOTE — Telephone Encounter (Signed)
Patient called back and confirmed what she needed. Copy left at front desk for her to come by to pick up.

## 2014-07-14 ENCOUNTER — Encounter: Payer: Self-pay | Admitting: Gynecology

## 2015-01-12 ENCOUNTER — Ambulatory Visit (INDEPENDENT_AMBULATORY_CARE_PROVIDER_SITE_OTHER): Payer: BLUE CROSS/BLUE SHIELD | Admitting: Internal Medicine

## 2015-01-12 VITALS — BP 106/66 | HR 57 | Temp 98.4°F | Resp 16 | Ht 70.25 in | Wt 133.5 lb

## 2015-01-12 DIAGNOSIS — L42 Pityriasis rosea: Secondary | ICD-10-CM | POA: Diagnosis not present

## 2015-01-12 DIAGNOSIS — R21 Rash and other nonspecific skin eruption: Secondary | ICD-10-CM

## 2015-01-12 LAB — POCT SKIN KOH: SKIN KOH, POC: NEGATIVE

## 2015-01-12 MED ORDER — TRIAMCINOLONE ACETONIDE 0.025 % EX CREA: 1.0000 "application " | TOPICAL_CREAM | Freq: Two times a day (BID) | CUTANEOUS | Status: AC

## 2015-01-12 MED ORDER — CETIRIZINE HCL 10 MG PO TABS: 10.0000 mg | ORAL_TABLET | Freq: Every day | ORAL | Status: AC

## 2015-01-12 NOTE — Patient Instructions (Signed)
Pityriasis Rosea  Pityriasis rosea is a rash which is probably caused by a virus. It generally starts as a scaly, red patch on the trunk (the area of the body that a t-shirt would cover) but does not appear on sun exposed areas. The rash is usually preceded by an initial larger spot called the "herald patch" a week or more before the rest of the rash appears. Generally within one to two days the rash appears rapidly on the trunk, upper arms, and sometimes the upper legs. The rash usually appears as flat, oval patches of scaly pink color. The rash can also be raised and one is able to feel it with a finger. The rash can also be finely crinkled and may slough off leaving a ring of scale around the spot. Sometimes a mild sore throat is present with the rash. It usually affects children and young adults in the spring and autumn. Women are more frequently affected than men.  TREATMENT   Pityriasis rosea is a self-limited condition. This means it goes away within 4 to 8 weeks without treatment. The spots may persist for several months, especially in darker-colored skin after the rash has resolved and healed. Benadryl and steroid creams may be used if itching is a problem.  SEEK MEDICAL CARE IF:   · Your rash does not go away or persists longer than three months.  · You develop fever and joint pain.  · You develop severe headache and confusion.  · You develop breathing difficulty, vomiting and/or extreme weakness.  Document Released: 10/05/2001 Document Revised: 11/21/2011 Document Reviewed: 10/24/2008  [employer]® Patient Information ©2015 [employer]. This information is not intended to replace advice given to you by your health care provider. Make sure you discuss any questions you have with your health care provider.

## 2015-01-12 NOTE — Progress Notes (Addendum)
   Subjective:    Patient ID: Victoria Schmitt, female    DOB: Dec 27, 1970, 44 y.o.   MRN: [id number]  HPI Chief Complaint  Patient presents with  . Rash   This chart was scribed for Tami Lin, MD by Thea Alken, ED Scribe. This patient was seen in room 12 and the patient's care was started at 6:35 PM.  HPI Comments: Victoria Schmitt is a 44 y.o. female who presents to the [facility] complaining of an itchy rash.  Pt states she stayed with her grandmother 2 weeks ago and reports 2-3 days later both her and her sister developed an itchy rash. She reports lesions appear in 2. She reports rash to torso, arms, back and posterior neck. She has tried benadryl without relief.   Married /one partner.   Past Surgical History  Procedure Laterality Date  . Tubal ligation    . Abdominal hysterectomy N/A 01/08/2013    Leiomyomata  Procedure: HYSTERECTOMY ABDOMINAL;  Surgeon: Anastasio Auerbach, MD;  Location: [facility];  Service: Gynecology;  Laterality: N/A;   Prior to Admission medications    none    Review of Systems  Skin: Positive for rash.   no fever chills or night sweats No sore throat No joint pain or swelling No dysuria  Objective:   Physical Exam  Constitutional: She is oriented to person, place, and time. She appears well-developed and well-nourished. No distress.  HENT:  Head: Normocephalic and atraumatic.  Eyes: Conjunctivae and EOM are normal.  Neck: Neck supple.  Cardiovascular: Normal rate.   Pulmonary/Chest: Effort normal.  Musculoskeletal: Normal range of motion.  Neurological: She is alert and oriented to person, place, and time.  Skin: Skin is warm and dry.  Numerous oval maculopapular lesions over trunk and extremities." Herald patch" right side neck resolving No lesions on the face palms or soles  Psychiatric: She has a normal mood and affect. Her behavior is normal.  Nursing note and vitals reviewed.  Results for orders placed or performed in  visit on 01/12/15  POCT Skin KOH  Result Value Ref Range   Skin KOH, POC Negative    Assessment & Plan:  Rash and nonspecific skin eruption -pityriasis rosea  Reassured  Meds ordered this encounter  Medications  . cetirizine (ZYRTEC) 10 MG tablet    Sig: Take 1 tablet (10 mg total) by mouth daily.    Dispense:  30 tablet    Refill:  0  . triamcinolone (KENALOG) 0.025 % cream    Sig: Apply 1 application topically 2 (two) times daily.    Dispense:  30 g    Refill:  0     I have completed the patient encounter in its entirety as documented by the scribe, with editing by me where necessary. Craige Patel P. Laney Pastor, M.D.

## 2015-01-13 DIAGNOSIS — D259 Leiomyoma of uterus, unspecified: Secondary | ICD-10-CM | POA: Insufficient documentation

## 2019-09-17 ENCOUNTER — Ambulatory Visit: Payer: Self-pay | Attending: Internal Medicine

## 2019-09-17 DIAGNOSIS — Z20822 Contact with and (suspected) exposure to covid-19: Secondary | ICD-10-CM | POA: Insufficient documentation

## 2019-09-19 LAB — NOVEL CORONAVIRUS, NAA: SARS-CoV-2, NAA: NOT DETECTED

## 2023-06-24 ENCOUNTER — Encounter (HOSPITAL_COMMUNITY): Payer: Self-pay

## 2023-06-24 ENCOUNTER — Emergency Department (HOSPITAL_COMMUNITY)
Admission: EM | Admit: 2023-06-24 | Discharge: 2023-06-25 | Disposition: A | Discharge status: Home / Self Care | Payer: Self-pay | Attending: Emergency Medicine | Admitting: Emergency Medicine

## 2023-06-24 ENCOUNTER — Other Ambulatory Visit: Payer: Self-pay

## 2023-06-24 DIAGNOSIS — M542 Cervicalgia: Secondary | ICD-10-CM | POA: Insufficient documentation

## 2023-06-24 DIAGNOSIS — M25522 Pain in left elbow: Secondary | ICD-10-CM | POA: Diagnosis not present

## 2023-06-24 DIAGNOSIS — M25562 Pain in left knee: Secondary | ICD-10-CM | POA: Insufficient documentation

## 2023-06-24 DIAGNOSIS — S0990XA Unspecified injury of head, initial encounter: Secondary | ICD-10-CM | POA: Diagnosis present

## 2023-06-24 DIAGNOSIS — Y92410 Unspecified street and highway as the place of occurrence of the external cause: Secondary | ICD-10-CM | POA: Diagnosis not present

## 2023-06-24 DIAGNOSIS — T07XXXA Unspecified multiple injuries, initial encounter: Secondary | ICD-10-CM
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
## 2023-06-25 ENCOUNTER — Emergency Department (HOSPITAL_COMMUNITY): Payer: Self-pay

## 2023-06-25 LAB — I-STAT CHEM 8, ED
BUN: 21 mg/dL — ABNORMAL HIGH (ref 6–20)
Calcium, Ion: 1.11 mmol/L — ABNORMAL LOW (ref 1.15–1.40)
Chloride: 109 mmol/L (ref 98–111)
Creatinine, Ser: 0.8 mg/dL (ref 0.44–1.00)
Glucose, Bld: 104 mg/dL — ABNORMAL HIGH (ref 70–99)
HCT: 36 % (ref 36.0–46.0)
Hemoglobin: 12.2 g/dL (ref 12.0–15.0)
Potassium: 4.2 mmol/L (ref 3.5–5.1)
Sodium: 142 mmol/L (ref 135–145)
TCO2: 24 mmol/L (ref 22–32)

## 2023-06-25 LAB — HCG, SERUM, QUALITATIVE: Preg, Serum: NEGATIVE

## 2023-06-25 MED ORDER — IBUPROFEN 800 MG PO TABS
800.0000 mg | ORAL_TABLET | Freq: Once | ORAL | Status: AC
  Administered 2023-06-25: 800 mg via ORAL
  Filled 2023-06-25: qty 1

## 2023-06-25 MED ORDER — HYDROCODONE-ACETAMINOPHEN 5-325 MG PO TABS
1.0000 | ORAL_TABLET | Freq: Once | ORAL | Status: AC
  Administered 2023-06-25: 1 via ORAL
  Filled 2023-06-25: qty 1

## 2023-06-25 MED ORDER — IOHEXOL 350 MG/ML SOLN
75.0000 mL | Freq: Once | INTRAVENOUS | Status: AC | PRN
  Administered 2023-06-25: 75 mL via INTRAVENOUS
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
